# Patient Record
Sex: Male | Born: 1955 | Race: White | Hispanic: No | Marital: Married | Smoking: Never smoker
Health system: Southern US, Community
[De-identification: ages and names within clinical notes are randomized; demographics above are authoritative.]

## PROBLEM LIST (undated history)

## (undated) DIAGNOSIS — I10 Essential (primary) hypertension: Secondary | ICD-10-CM

## (undated) DIAGNOSIS — M1990 Unspecified osteoarthritis, unspecified site: Secondary | ICD-10-CM

## (undated) DIAGNOSIS — D3613 Benign neoplasm of peripheral nerves and autonomic nervous system of lower limb, including hip: Secondary | ICD-10-CM

## (undated) DIAGNOSIS — E7800 Pure hypercholesterolemia, unspecified: Secondary | ICD-10-CM

## (undated) HISTORY — PX: HEMORRHOID SURGERY: SHX153

## (undated) HISTORY — PX: TONSILLECTOMY: SUR1361

## (undated) HISTORY — PX: FRACTURE SURGERY: SHX138

## (undated) HISTORY — PX: VASECTOMY: SHX75

---

## 2003-03-13 ENCOUNTER — Ambulatory Visit (HOSPITAL_COMMUNITY): Admission: RE | Admit: 2003-03-13 | Discharge: 2003-03-13 | Payer: Self-pay | Admitting: Gastroenterology

## 2012-10-18 DIAGNOSIS — D126 Benign neoplasm of colon, unspecified: Secondary | ICD-10-CM | POA: Insufficient documentation

## 2013-08-14 ENCOUNTER — Other Ambulatory Visit: Payer: Self-pay | Admitting: Gastroenterology

## 2013-09-18 ENCOUNTER — Encounter (HOSPITAL_COMMUNITY): Payer: Self-pay

## 2013-09-18 ENCOUNTER — Encounter (HOSPITAL_COMMUNITY)
Admission: RE | Disposition: A | Discharge status: Home / Self Care | Payer: Self-pay | Source: Ambulatory Visit | Attending: Gastroenterology

## 2013-09-18 ENCOUNTER — Ambulatory Visit (HOSPITAL_COMMUNITY)
Admission: RE | Admit: 2013-09-18 | Discharge: 2013-09-18 | Disposition: A | Discharge status: Home / Self Care | Payer: Federal, State, Local not specified - PPO | Source: Ambulatory Visit | Attending: Gastroenterology | Admitting: Gastroenterology

## 2013-09-18 DIAGNOSIS — E7800 Pure hypercholesterolemia, unspecified: Secondary | ICD-10-CM | POA: Insufficient documentation

## 2013-09-18 DIAGNOSIS — D126 Benign neoplasm of colon, unspecified: Secondary | ICD-10-CM | POA: Insufficient documentation

## 2013-09-18 DIAGNOSIS — K921 Melena: Secondary | ICD-10-CM | POA: Insufficient documentation

## 2013-09-18 DIAGNOSIS — K5730 Diverticulosis of large intestine without perforation or abscess without bleeding: Secondary | ICD-10-CM | POA: Insufficient documentation

## 2013-09-18 HISTORY — PX: COLONOSCOPY: SHX5424

## 2013-09-18 HISTORY — DX: Pure hypercholesterolemia, unspecified: E78.00

## 2013-09-18 SURGERY — COLONOSCOPY
Anesthesia: Moderate Sedation

## 2013-09-18 MED ORDER — MIDAZOLAM HCL 10 MG/2ML IJ SOLN
INTRAMUSCULAR | Status: DC | PRN
  Administered 2013-09-18 (×3): 2.5 mg via INTRAVENOUS

## 2013-09-18 MED ORDER — SODIUM CHLORIDE 0.9 % IV SOLN: INTRAVENOUS | Status: DC

## 2013-09-18 MED ORDER — FENTANYL CITRATE 0.05 MG/ML IJ SOLN
INTRAMUSCULAR | Status: DC | PRN
  Administered 2013-09-18: 25 ug via INTRAVENOUS
  Administered 2013-09-18: 50 ug via INTRAVENOUS

## 2013-09-18 MED ORDER — DIPHENHYDRAMINE HCL 50 MG/ML IJ SOLN
INTRAMUSCULAR | Status: AC
  Filled 2013-09-18: qty 1

## 2013-09-18 MED ORDER — FENTANYL CITRATE 0.05 MG/ML IJ SOLN
INTRAMUSCULAR | Status: AC
  Filled 2013-09-18: qty 2

## 2013-09-18 MED ORDER — MIDAZOLAM HCL 10 MG/2ML IJ SOLN
INTRAMUSCULAR | Status: AC
  Filled 2013-09-18: qty 2

## 2013-09-18 NOTE — H&P (Signed)
  Problem: Small-volume hematochezia on aspirin. Normal CBC.  History: The patient is a 57 year old male born Nov 15, 1955. He underwent a normal screening colonoscopy on 03/13/2003.  The patient is scheduled to undergo a diagnostic colonoscopy following an episode of small-volume hematochezia.  Allergies: Lipitor and Crestor causes muscle aches.  Past medical history: Hypercholesterolemia. Nasal surgery. Hemorrhoidectomy. Tonsillectomy. Left ankle fracture surgery.  Exam: The patient is alert and lying comfortably on the endoscopy stretcher. Abdomen is soft and nontender to palpation. Lungs are clear to auscultation. Cardiac exam reveals a regular rhythm  Plan: Proceed with diagnostic colonoscopy to evaluate small-volume hematochezia

## 2013-09-18 NOTE — Op Note (Signed)
Problem: Small-volume hematochezia  Endoscopist: Danise Edge  Premedication: Versed 7.5 mg. Fentanyl 75 mcg  Procedure: Diagnostic colonoscopy The patient was placed in the left lateral decubitus position. Anal inspection and digital rectal exam were normal. The Pentax pediatric colonoscope was introduced into the rectum and easily advanced to the cecum. A normal-appearing ileocecal valve and appendiceal orifice were identified. The ileocecal valve was intubated and the terminal ileum inspected. Colonic preparation for the exam today was good.  Rectum. Normal. Retroflexed view of the distal rectum normal.  Sigmoid colon and descending colon. Left colonic diverticulosis  Splenic flexure. Normal  Transverse colon. Normal  Hepatic flexure. Normal  Ascending colon. Normal  Cecum and ileocecal valve. From the proximal cecum a 3 mm sessile polyp was removed with the cold biopsy forceps.  Assessment:  #1. From the cecum, a 3 mm sessile polyp was removed with the cold biopsy forceps  #2. Left colonic diverticulosis  Recommendations: If colon polyp returns neoplastic pathologically, the patient should undergo a surveillance colonoscopy in 5 years. If the colon polyp returns nonneoplastic pathologically, the patient should undergo a repeat screening colonoscopy in 10 years.

## 2013-09-19 ENCOUNTER — Encounter (HOSPITAL_COMMUNITY): Payer: Self-pay | Admitting: Gastroenterology

## 2013-12-19 ENCOUNTER — Ambulatory Visit (INDEPENDENT_AMBULATORY_CARE_PROVIDER_SITE_OTHER): Payer: Federal, State, Local not specified - PPO | Admitting: Family Medicine

## 2013-12-19 VITALS — BP 120/76 | HR 62 | Temp 98.0°F | Resp 16 | Ht 66.5 in | Wt 203.2 lb

## 2013-12-19 DIAGNOSIS — J329 Chronic sinusitis, unspecified: Secondary | ICD-10-CM

## 2013-12-19 MED ORDER — AMOXICILLIN 875 MG PO TABS: 875.0000 mg | ORAL_TABLET | Freq: Two times a day (BID) | ORAL | Status: DC

## 2013-12-19 NOTE — Progress Notes (Signed)
° ° ° °  This chart was scribed for Robyn Haber, MD by Forrestine Him, [facility] Scribe. This patient was seen in room 5 and the patient's care was started 7:47 PM.    Patient Name: Jonathan Peters Date of Birth: 1956/06/21 Medical Record Number: [id number] Gender: male Date of Encounter: 12/19/2013  Chief Complaint: Headache   History of Present Illness:  Jonathan Peters is a 58 y.o. very pleasant male patient who presents with the following:  Pt presents with gradual onset, intermittent, moderate left sided HAs described as "dull" with associated sinus pressure that initially started 3-4 days ago. Pt admits to a history or a similar episode, but states he typically does not experience HAs regularly. He has not tried anything OTC for his symptoms. Denies any alleviating factors. At this time he denies any visual changes or disturbances. Denies any hearing loss, cough, or abdominal pain. His PMHx includes Hypercholesterolemia. No other complaints or concerns this visit.  Pt currently works in [employer].  There are no active problems to display for this patient.  Past Medical History  Diagnosis Date   Hypercholesterolemia    Past Surgical History  Procedure Laterality Date   Hemorrhoid surgery     Tonsillectomy     Fracture surgery Left     ankle   Colonoscopy N/A 09/18/2013    Procedure: COLONOSCOPY;  Surgeon: Garlan Fair, MD;  Location: [facility];  Service: Endoscopy;  Laterality: N/A;   Vasectomy     History  Substance Use Topics   Smoking status: Never Smoker    Smokeless tobacco: Not on file   Alcohol Use: 8.4 oz/week    14 Cans of beer per week   Family History  Problem Relation Age of Onset   Cancer Mother    Hypertension Father    No Known Allergies  Medication list has been reviewed and updated.  Current Outpatient Prescriptions on File Prior to Visit  Medication Sig Dispense Refill   aspirin 81 MG tablet Take 81  mg by mouth daily.       cholecalciferol (VITAMIN D) 1000 UNITS tablet Take 1,000 Units by mouth daily.       ezetimibe (ZETIA) 10 MG tablet Take 10 mg by mouth daily.       Multiple Vitamin (MULTIVITAMIN) tablet Take 1 tablet by mouth daily.       omega-3 acid ethyl esters (LOVAZA) 1 G capsule Take 1 g by mouth daily.       No current facility-administered medications on file prior to visit.    Review of Systems:    Physical Examination: Filed Vitals:   12/19/13 1923  BP: 120/76  Pulse: 62  Temp: 98 F (36.7 C)  Resp: 16   @vitals2 @ Body mass index is 32.31 kg/(m^2). Ideal Body Weight: @FLOWAMB ([id number])@ No acute distress HEENT: Mild nasal swelling bilaterally, otherwise negative Chest: Clear Heart: Regular no murmur Hearing normal  EKG / Labs / Xrays: None available at time of encounter  Assessment and Plan: Sinusitis - Plan: amoxicillin (AMOXIL) 875 MG tablet    I personally performed the services described in this documentation, which was scribed in my presence. The recorded information has been reviewed and is accurate.    Robyn Haber, MD

## 2013-12-19 NOTE — Patient Instructions (Signed)

## 2013-12-26 ENCOUNTER — Telehealth: Payer: Self-pay

## 2013-12-26 DIAGNOSIS — J329 Chronic sinusitis, unspecified: Secondary | ICD-10-CM

## 2013-12-26 NOTE — Telephone Encounter (Signed)
Patient is requesting stronger antibiotic for sinus treatment.  Somewhat better.  [facility]   [phone]

## 2013-12-26 NOTE — Telephone Encounter (Signed)
Not as bad, still having headaches towards the evening and at night. taking ibuprofen OTC 1 tablet every 4-6 hours as needed, is helping but not completely. Instructed patient to take 2 (200mg ) tablets of ibuprofen every 4-6 hours to se if this aids in relieving his headaches. Instructed to try this for a few days and if the headaches do not subside call back.

## 2013-12-29 NOTE — Telephone Encounter (Signed)
PT STILL WANTS SOMETHING STRONGER. THE DOUBLE DOSE OF IBUPROFEN DID NOT WORK. HE STILL HAS HEADACHES.

## 2014-01-01 MED ORDER — TRAMADOL HCL 50 MG PO TABS: 50.0000 mg | ORAL_TABLET | Freq: Three times a day (TID) | ORAL | Status: DC | PRN

## 2014-01-01 NOTE — Telephone Encounter (Signed)
Pt stated that the ibuprofen helped only a little bit but he would like something else.

## 2014-01-02 ENCOUNTER — Telehealth: Payer: Self-pay

## 2014-01-02 NOTE — Telephone Encounter (Signed)
Faxed

## 2014-01-02 NOTE — Telephone Encounter (Signed)
Patient notified medication is at pharmacy. I confirmed with pharmacy medication is ready for pick-up

## 2014-01-02 NOTE — Telephone Encounter (Signed)
Patient is requesting antibiotic still has sinus infection, has been waiting for a phone call back but no one has called him, he is going out of town tomorrow

## 2014-01-02 NOTE — Telephone Encounter (Signed)
Dr. Carlean Jews prescribed tramadol 3/17. This needs to be called into the pharmacy [facility].

## 2014-01-02 NOTE — Telephone Encounter (Signed)
Notified pt done  

## 2014-01-03 ENCOUNTER — Other Ambulatory Visit: Payer: Self-pay | Admitting: Family Medicine

## 2014-01-03 ENCOUNTER — Telehealth: Payer: Self-pay

## 2014-01-03 DIAGNOSIS — R059 Cough, unspecified: Secondary | ICD-10-CM

## 2014-01-03 DIAGNOSIS — R05 Cough: Secondary | ICD-10-CM

## 2014-01-03 MED ORDER — AZITHROMYCIN 250 MG PO TABS: ORAL_TABLET | ORAL | Status: DC

## 2014-01-03 MED ORDER — HYDROCOD POLST-CHLORPHEN POLST 10-8 MG/5ML PO LQCR: 5.0000 mL | Freq: Two times a day (BID) | ORAL | Status: DC | PRN

## 2014-01-03 NOTE — Telephone Encounter (Signed)
Called and spoke with pt's wife. States that pt took [place] that was prescribed when he saw you at the visit on 12/19/13. Wife states that pateint is still feeling no better and was wanting something other then the tramadol that was sent in/ A stronger antibiotic. States that patient is continuing to have symptoms and feels worse. Wife also stated that they were on there way out of town so if you were able to send in a strong antibiotic for him they would want it sent to the [facility] near their hotel in [place]. [facility] [phone]. [place] . [place]

## 2014-01-03 NOTE — Telephone Encounter (Signed)
We called in tramadol yesterday for this patient, he wanted an antibiotic not pain medication [phone]

## 2015-04-24 ENCOUNTER — Other Ambulatory Visit: Payer: Self-pay | Admitting: Internal Medicine

## 2015-04-24 DIAGNOSIS — R253 Fasciculation: Secondary | ICD-10-CM

## 2015-04-24 DIAGNOSIS — R29898 Other symptoms and signs involving the musculoskeletal system: Secondary | ICD-10-CM

## 2015-04-24 DIAGNOSIS — M542 Cervicalgia: Secondary | ICD-10-CM

## 2015-04-29 ENCOUNTER — Ambulatory Visit
Admission: RE | Admit: 2015-04-29 | Discharge: 2015-04-29 | Disposition: A | Discharge status: Home / Self Care | Payer: 59 | Source: Ambulatory Visit | Attending: Internal Medicine | Admitting: Internal Medicine

## 2015-04-29 DIAGNOSIS — M542 Cervicalgia: Secondary | ICD-10-CM

## 2015-04-29 DIAGNOSIS — R29898 Other symptoms and signs involving the musculoskeletal system: Secondary | ICD-10-CM

## 2015-04-29 DIAGNOSIS — R253 Fasciculation: Secondary | ICD-10-CM

## 2017-03-23 DIAGNOSIS — M170 Bilateral primary osteoarthritis of knee: Secondary | ICD-10-CM | POA: Diagnosis not present

## 2017-03-31 DIAGNOSIS — M170 Bilateral primary osteoarthritis of knee: Secondary | ICD-10-CM | POA: Diagnosis not present

## 2017-04-08 DIAGNOSIS — M170 Bilateral primary osteoarthritis of knee: Secondary | ICD-10-CM | POA: Diagnosis not present

## 2017-04-08 DIAGNOSIS — M545 Low back pain: Secondary | ICD-10-CM | POA: Diagnosis not present

## 2017-04-27 DIAGNOSIS — M5136 Other intervertebral disc degeneration, lumbar region: Secondary | ICD-10-CM | POA: Diagnosis not present

## 2017-04-27 DIAGNOSIS — M545 Low back pain: Secondary | ICD-10-CM | POA: Diagnosis not present

## 2017-06-09 DIAGNOSIS — M5136 Other intervertebral disc degeneration, lumbar region: Secondary | ICD-10-CM | POA: Diagnosis not present

## 2017-12-26 DIAGNOSIS — M1710 Unilateral primary osteoarthritis, unspecified knee: Secondary | ICD-10-CM | POA: Insufficient documentation

## 2017-12-26 DIAGNOSIS — M179 Osteoarthritis of knee, unspecified: Secondary | ICD-10-CM | POA: Insufficient documentation

## 2018-01-09 DIAGNOSIS — L570 Actinic keratosis: Secondary | ICD-10-CM | POA: Diagnosis not present

## 2018-03-09 DIAGNOSIS — M170 Bilateral primary osteoarthritis of knee: Secondary | ICD-10-CM | POA: Diagnosis not present

## 2018-04-14 DIAGNOSIS — M170 Bilateral primary osteoarthritis of knee: Secondary | ICD-10-CM | POA: Diagnosis not present

## 2018-04-19 DIAGNOSIS — M170 Bilateral primary osteoarthritis of knee: Secondary | ICD-10-CM | POA: Diagnosis not present

## 2018-04-28 DIAGNOSIS — M170 Bilateral primary osteoarthritis of knee: Secondary | ICD-10-CM | POA: Diagnosis not present

## 2018-05-09 ENCOUNTER — Other Ambulatory Visit: Payer: Self-pay | Admitting: Internal Medicine

## 2018-05-09 ENCOUNTER — Ambulatory Visit
Admission: RE | Admit: 2018-05-09 | Discharge: 2018-05-09 | Disposition: A | Discharge status: Home / Self Care | Payer: 59 | Source: Ambulatory Visit | Attending: Internal Medicine | Admitting: Internal Medicine

## 2018-05-09 DIAGNOSIS — R1084 Generalized abdominal pain: Secondary | ICD-10-CM

## 2018-05-09 DIAGNOSIS — K5900 Constipation, unspecified: Secondary | ICD-10-CM | POA: Diagnosis not present

## 2018-10-24 DIAGNOSIS — M170 Bilateral primary osteoarthritis of knee: Secondary | ICD-10-CM | POA: Diagnosis not present

## 2018-12-01 DIAGNOSIS — I10 Essential (primary) hypertension: Secondary | ICD-10-CM | POA: Diagnosis not present

## 2018-12-01 DIAGNOSIS — Z125 Encounter for screening for malignant neoplasm of prostate: Secondary | ICD-10-CM | POA: Diagnosis not present

## 2018-12-01 DIAGNOSIS — E7800 Pure hypercholesterolemia, unspecified: Secondary | ICD-10-CM | POA: Diagnosis not present

## 2018-12-01 DIAGNOSIS — Z0000 Encounter for general adult medical examination without abnormal findings: Secondary | ICD-10-CM | POA: Diagnosis not present

## 2019-02-22 DIAGNOSIS — M170 Bilateral primary osteoarthritis of knee: Secondary | ICD-10-CM | POA: Diagnosis not present

## 2019-03-01 DIAGNOSIS — M170 Bilateral primary osteoarthritis of knee: Secondary | ICD-10-CM | POA: Diagnosis not present

## 2019-03-08 DIAGNOSIS — M170 Bilateral primary osteoarthritis of knee: Secondary | ICD-10-CM | POA: Diagnosis not present

## 2019-03-19 DIAGNOSIS — K5900 Constipation, unspecified: Secondary | ICD-10-CM | POA: Diagnosis not present

## 2019-03-19 DIAGNOSIS — K635 Polyp of colon: Secondary | ICD-10-CM | POA: Diagnosis not present

## 2019-03-19 DIAGNOSIS — K649 Unspecified hemorrhoids: Secondary | ICD-10-CM | POA: Diagnosis not present

## 2019-03-22 DIAGNOSIS — K6289 Other specified diseases of anus and rectum: Secondary | ICD-10-CM | POA: Diagnosis not present

## 2019-03-22 DIAGNOSIS — K625 Hemorrhage of anus and rectum: Secondary | ICD-10-CM | POA: Diagnosis not present

## 2019-03-22 DIAGNOSIS — K5900 Constipation, unspecified: Secondary | ICD-10-CM | POA: Diagnosis not present

## 2019-04-06 DIAGNOSIS — Z86010 Personal history of colonic polyps: Secondary | ICD-10-CM | POA: Diagnosis not present

## 2019-04-06 DIAGNOSIS — K5730 Diverticulosis of large intestine without perforation or abscess without bleeding: Secondary | ICD-10-CM | POA: Diagnosis not present

## 2019-04-06 DIAGNOSIS — K621 Rectal polyp: Secondary | ICD-10-CM | POA: Diagnosis not present

## 2019-04-06 DIAGNOSIS — K635 Polyp of colon: Secondary | ICD-10-CM | POA: Diagnosis not present

## 2019-04-06 DIAGNOSIS — D120 Benign neoplasm of cecum: Secondary | ICD-10-CM | POA: Diagnosis not present

## 2019-04-06 DIAGNOSIS — K640 First degree hemorrhoids: Secondary | ICD-10-CM | POA: Diagnosis not present

## 2019-05-18 DIAGNOSIS — M170 Bilateral primary osteoarthritis of knee: Secondary | ICD-10-CM | POA: Diagnosis not present

## 2019-06-13 DIAGNOSIS — J342 Deviated nasal septum: Secondary | ICD-10-CM | POA: Diagnosis not present

## 2019-06-13 DIAGNOSIS — M278 Other specified diseases of jaws: Secondary | ICD-10-CM | POA: Diagnosis not present

## 2019-06-13 DIAGNOSIS — K080 Exfoliation of teeth due to systemic causes: Secondary | ICD-10-CM | POA: Diagnosis not present

## 2019-06-13 DIAGNOSIS — K0500 Acute gingivitis, plaque induced: Secondary | ICD-10-CM | POA: Diagnosis not present

## 2019-06-13 DIAGNOSIS — K011 Impacted teeth: Secondary | ICD-10-CM | POA: Diagnosis not present

## 2019-06-13 DIAGNOSIS — K148 Other diseases of tongue: Secondary | ICD-10-CM | POA: Diagnosis not present

## 2019-06-13 DIAGNOSIS — K143 Hypertrophy of tongue papillae: Secondary | ICD-10-CM | POA: Diagnosis not present

## 2019-06-13 DIAGNOSIS — J3489 Other specified disorders of nose and nasal sinuses: Secondary | ICD-10-CM | POA: Diagnosis not present

## 2019-06-15 DIAGNOSIS — M170 Bilateral primary osteoarthritis of knee: Secondary | ICD-10-CM | POA: Diagnosis not present

## 2019-06-29 ENCOUNTER — Other Ambulatory Visit: Payer: Self-pay

## 2019-06-29 DIAGNOSIS — K3533 Acute appendicitis with perforation and localized peritonitis, with abscess: Principal | ICD-10-CM | POA: Insufficient documentation

## 2019-06-29 DIAGNOSIS — Z79899 Other long term (current) drug therapy: Secondary | ICD-10-CM | POA: Diagnosis not present

## 2019-06-29 DIAGNOSIS — E7800 Pure hypercholesterolemia, unspecified: Secondary | ICD-10-CM | POA: Diagnosis not present

## 2019-06-29 DIAGNOSIS — R109 Unspecified abdominal pain: Secondary | ICD-10-CM | POA: Diagnosis not present

## 2019-06-29 DIAGNOSIS — K5730 Diverticulosis of large intestine without perforation or abscess without bleeding: Secondary | ICD-10-CM | POA: Diagnosis not present

## 2019-06-29 DIAGNOSIS — Z03818 Encounter for observation for suspected exposure to other biological agents ruled out: Secondary | ICD-10-CM | POA: Diagnosis not present

## 2019-06-29 DIAGNOSIS — K37 Unspecified appendicitis: Secondary | ICD-10-CM | POA: Diagnosis not present

## 2019-06-29 DIAGNOSIS — E785 Hyperlipidemia, unspecified: Secondary | ICD-10-CM | POA: Insufficient documentation

## 2019-06-29 DIAGNOSIS — Z7982 Long term (current) use of aspirin: Secondary | ICD-10-CM | POA: Diagnosis not present

## 2019-06-29 DIAGNOSIS — Z20828 Contact with and (suspected) exposure to other viral communicable diseases: Secondary | ICD-10-CM | POA: Insufficient documentation

## 2019-06-29 DIAGNOSIS — K3580 Unspecified acute appendicitis: Secondary | ICD-10-CM | POA: Diagnosis not present

## 2019-06-29 LAB — CBC
HCT: 43.7 % (ref 39.0–52.0)
Hemoglobin: 15 g/dL (ref 13.0–17.0)
MCH: 31 pg (ref 26.0–34.0)
MCHC: 34.3 g/dL (ref 30.0–36.0)
MCV: 90.3 fL (ref 80.0–100.0)
Platelets: 225 10*3/uL (ref 150–400)
RBC: 4.84 MIL/uL (ref 4.22–5.81)
RDW: 12.9 % (ref 11.5–15.5)
WBC: 12.6 10*3/uL — ABNORMAL HIGH (ref 4.0–10.5)
nRBC: 0 % (ref 0.0–0.2)

## 2019-06-29 LAB — LIPASE, BLOOD: Lipase: 16 U/L (ref 11–51)

## 2019-06-29 LAB — COMPREHENSIVE METABOLIC PANEL
ALT: 24 U/L (ref 0–44)
AST: 24 U/L (ref 15–41)
Albumin: 4.4 g/dL (ref 3.5–5.0)
Alkaline Phosphatase: 48 U/L (ref 38–126)
Anion gap: 12 (ref 5–15)
BUN: 17 mg/dL (ref 8–23)
CO2: 23 mmol/L (ref 22–32)
Calcium: 9 mg/dL (ref 8.9–10.3)
Chloride: 103 mmol/L (ref 98–111)
Creatinine, Ser: 1.06 mg/dL (ref 0.61–1.24)
GFR calc Af Amer: >60 mL/min (ref >60)
GFR calc non Af Amer: >60 mL/min (ref >60)
Glucose, Bld: 127 mg/dL — ABNORMAL HIGH (ref 70–99)
Potassium: 3.5 mmol/L (ref 3.5–5.1)
Sodium: 138 mmol/L (ref 135–145)
Total Bilirubin: 1.1 mg/dL (ref 0.3–1.2)
Total Protein: 7.2 g/dL (ref 6.5–8.1)

## 2019-06-29 MED ORDER — SODIUM CHLORIDE 0.9% FLUSH
3.0000 mL | Freq: Once | INTRAVENOUS | Status: AC
  Administered 2019-06-30: 3 mL via INTRAVENOUS

## 2019-06-29 NOTE — ED Triage Notes (Signed)
Patient reports abdominal pain since 4:30 pm that has gotten progressively worse.

## 2019-06-29 NOTE — ED Notes (Signed)
Patient to stat desk asking about wait time. Patient given update on wait time.  

## 2019-06-30 ENCOUNTER — Encounter
Admission: EM | Disposition: A | Discharge status: Home / Self Care | Payer: Self-pay | Source: Home / Self Care | Attending: Emergency Medicine

## 2019-06-30 ENCOUNTER — Observation Stay: Payer: Federal, State, Local not specified - PPO | Admitting: Anesthesiology

## 2019-06-30 ENCOUNTER — Observation Stay
Admission: EM | Admit: 2019-06-30 | Discharge: 2019-07-01 | Disposition: A | Discharge status: Home / Self Care | Payer: Federal, State, Local not specified - PPO | Attending: Surgery | Admitting: Surgery

## 2019-06-30 ENCOUNTER — Emergency Department: Payer: Federal, State, Local not specified - PPO

## 2019-06-30 DIAGNOSIS — K3580 Unspecified acute appendicitis: Secondary | ICD-10-CM | POA: Diagnosis not present

## 2019-06-30 DIAGNOSIS — K37 Unspecified appendicitis: Secondary | ICD-10-CM | POA: Diagnosis not present

## 2019-06-30 DIAGNOSIS — K5730 Diverticulosis of large intestine without perforation or abscess without bleeding: Secondary | ICD-10-CM | POA: Diagnosis not present

## 2019-06-30 HISTORY — PX: LAPAROSCOPIC APPENDECTOMY: SHX408

## 2019-06-30 LAB — CREATININE, SERUM
Creatinine, Ser: 0.99 mg/dL (ref 0.61–1.24)
GFR calc Af Amer: >60 mL/min (ref >60)
GFR calc non Af Amer: >60 mL/min (ref >60)

## 2019-06-30 LAB — URINALYSIS, COMPLETE (UACMP) WITH MICROSCOPIC
Bacteria, UA: NONE SEEN
Bilirubin Urine: NEGATIVE
Glucose, UA: NEGATIVE mg/dL
Ketones, ur: 20 mg/dL — AB
Leukocytes,Ua: NEGATIVE
Nitrite: NEGATIVE
Protein, ur: NEGATIVE mg/dL
Specific Gravity, Urine: 1.024 (ref 1.005–1.030)
Squamous Epithelial / LPF: NONE SEEN (ref 0–5)
pH: 5 (ref 5.0–8.0)

## 2019-06-30 LAB — SARS CORONAVIRUS 2 BY RT PCR (HOSPITAL ORDER, PERFORMED IN ~~LOC~~ HOSPITAL LAB): SARS Coronavirus 2: NEGATIVE

## 2019-06-30 LAB — SURGICAL PCR SCREEN
MRSA, PCR: NEGATIVE
Staphylococcus aureus: NEGATIVE

## 2019-06-30 SURGERY — APPENDECTOMY, LAPAROSCOPIC
Anesthesia: General

## 2019-06-30 MED ORDER — ONDANSETRON HCL 4 MG/2ML IJ SOLN
INTRAMUSCULAR | Status: AC
  Filled 2019-06-30: qty 2

## 2019-06-30 MED ORDER — LACTATED RINGERS IV SOLN
INTRAVENOUS | Status: DC | PRN
  Administered 2019-06-30: 14:00:00 via INTRAVENOUS

## 2019-06-30 MED ORDER — PROMETHAZINE HCL 25 MG/ML IJ SOLN: 6.2500 mg | INTRAMUSCULAR | Status: DC | PRN

## 2019-06-30 MED ORDER — LIDOCAINE HCL (PF) 2 % IJ SOLN
INTRAMUSCULAR | Status: AC
  Filled 2019-06-30: qty 10

## 2019-06-30 MED ORDER — PROCHLORPERAZINE EDISYLATE 10 MG/2ML IJ SOLN
5.0000 mg | Freq: Four times a day (QID) | INTRAMUSCULAR | Status: DC | PRN
  Filled 2019-06-30: qty 2

## 2019-06-30 MED ORDER — DEXMEDETOMIDINE HCL IN NACL 200 MCG/50ML IV SOLN
INTRAVENOUS | Status: DC | PRN
  Administered 2019-06-30: 16 ug via INTRAVENOUS
  Administered 2019-06-30: 4 ug via INTRAVENOUS

## 2019-06-30 MED ORDER — ACETAMINOPHEN 325 MG PO TABS: 325.0000 mg | ORAL_TABLET | ORAL | Status: DC | PRN

## 2019-06-30 MED ORDER — MIDAZOLAM HCL 2 MG/2ML IJ SOLN
INTRAMUSCULAR | Status: AC
  Filled 2019-06-30: qty 2

## 2019-06-30 MED ORDER — DEXAMETHASONE SODIUM PHOSPHATE 10 MG/ML IJ SOLN
INTRAMUSCULAR | Status: DC | PRN
  Administered 2019-06-30: 10 mg via INTRAVENOUS

## 2019-06-30 MED ORDER — IOHEXOL 300 MG/ML  SOLN
100.0000 mL | Freq: Once | INTRAMUSCULAR | Status: AC | PRN
  Administered 2019-06-30: 100 mL via INTRAVENOUS

## 2019-06-30 MED ORDER — ROCURONIUM BROMIDE 50 MG/5ML IV SOLN
INTRAVENOUS | Status: AC
  Filled 2019-06-30: qty 1

## 2019-06-30 MED ORDER — PIPERACILLIN-TAZOBACTAM 3.375 G IVPB
3.3750 g | Freq: Three times a day (TID) | INTRAVENOUS | Status: DC
  Administered 2019-06-30 – 2019-07-01 (×4): 3.375 g via INTRAVENOUS
  Filled 2019-06-30 (×4): qty 50

## 2019-06-30 MED ORDER — BUPIVACAINE-EPINEPHRINE (PF) 0.25% -1:200000 IJ SOLN
INTRAMUSCULAR | Status: AC
  Filled 2019-06-30: qty 30

## 2019-06-30 MED ORDER — KETOROLAC TROMETHAMINE 30 MG/ML IJ SOLN
30.0000 mg | Freq: Four times a day (QID) | INTRAMUSCULAR | Status: DC | PRN
  Filled 2019-06-30: qty 1

## 2019-06-30 MED ORDER — HYDRALAZINE HCL 20 MG/ML IJ SOLN: 10.0000 mg | INTRAMUSCULAR | Status: DC | PRN

## 2019-06-30 MED ORDER — PROCHLORPERAZINE MALEATE 10 MG PO TABS
10.0000 mg | ORAL_TABLET | Freq: Four times a day (QID) | ORAL | Status: DC | PRN
  Filled 2019-06-30: qty 1

## 2019-06-30 MED ORDER — ACETAMINOPHEN 160 MG/5ML PO SOLN
325.0000 mg | ORAL | Status: DC | PRN
  Filled 2019-06-30: qty 20.3

## 2019-06-30 MED ORDER — SUGAMMADEX SODIUM 200 MG/2ML IV SOLN
INTRAVENOUS | Status: DC | PRN
  Administered 2019-06-30: 200 mg via INTRAVENOUS

## 2019-06-30 MED ORDER — ROCURONIUM BROMIDE 100 MG/10ML IV SOLN
INTRAVENOUS | Status: DC | PRN
  Administered 2019-06-30: 40 mg via INTRAVENOUS

## 2019-06-30 MED ORDER — KETOROLAC TROMETHAMINE 30 MG/ML IJ SOLN: 30.0000 mg | Freq: Once | INTRAMUSCULAR | Status: DC | PRN

## 2019-06-30 MED ORDER — SODIUM CHLORIDE 0.9 % IV SOLN
INTRAVENOUS | Status: DC
  Administered 2019-06-30: 07:00:00 via INTRAVENOUS

## 2019-06-30 MED ORDER — PROPOFOL 10 MG/ML IV BOLUS
INTRAVENOUS | Status: DC | PRN
  Administered 2019-06-30: 200 mg via INTRAVENOUS

## 2019-06-30 MED ORDER — ONDANSETRON HCL 4 MG/2ML IJ SOLN: 4.0000 mg | Freq: Four times a day (QID) | INTRAMUSCULAR | Status: DC | PRN

## 2019-06-30 MED ORDER — ONDANSETRON 4 MG PO TBDP: 4.0000 mg | ORAL_TABLET | Freq: Four times a day (QID) | ORAL | Status: DC | PRN

## 2019-06-30 MED ORDER — PROPOFOL 10 MG/ML IV BOLUS
INTRAVENOUS | Status: AC
  Filled 2019-06-30: qty 20

## 2019-06-30 MED ORDER — PIPERACILLIN-TAZOBACTAM 3.375 G IVPB: 3.3750 g | Freq: Three times a day (TID) | INTRAVENOUS | Status: DC

## 2019-06-30 MED ORDER — HEPARIN SODIUM (PORCINE) 5000 UNIT/ML IJ SOLN
5000.0000 [IU] | Freq: Three times a day (TID) | INTRAMUSCULAR | Status: DC
  Administered 2019-06-30 – 2019-07-01 (×3): 5000 [IU] via SUBCUTANEOUS
  Filled 2019-06-30 (×3): qty 1

## 2019-06-30 MED ORDER — PANTOPRAZOLE SODIUM 40 MG IV SOLR
40.0000 mg | Freq: Every day | INTRAVENOUS | Status: DC
  Administered 2019-06-30: 40 mg via INTRAVENOUS
  Filled 2019-06-30: qty 40

## 2019-06-30 MED ORDER — HYDROCODONE-ACETAMINOPHEN 7.5-325 MG PO TABS
1.0000 | ORAL_TABLET | Freq: Once | ORAL | Status: DC | PRN
  Filled 2019-06-30: qty 1

## 2019-06-30 MED ORDER — ACETAMINOPHEN 10 MG/ML IV SOLN
INTRAVENOUS | Status: DC | PRN
  Administered 2019-06-30: 1000 mg via INTRAVENOUS

## 2019-06-30 MED ORDER — PHENYLEPHRINE HCL (PRESSORS) 10 MG/ML IV SOLN
INTRAVENOUS | Status: DC | PRN
  Administered 2019-06-30 (×2): 200 ug via INTRAVENOUS

## 2019-06-30 MED ORDER — FENTANYL CITRATE (PF) 100 MCG/2ML IJ SOLN: 25.0000 ug | INTRAMUSCULAR | Status: DC | PRN

## 2019-06-30 MED ORDER — SODIUM CHLORIDE 0.9 % IV SOLN
INTRAVENOUS | Status: DC
  Administered 2019-06-30: 16:00:00 via INTRAVENOUS

## 2019-06-30 MED ORDER — SODIUM CHLORIDE 0.9 % IV BOLUS
1000.0000 mL | Freq: Once | INTRAVENOUS | Status: AC
  Administered 2019-06-30: 1000 mL via INTRAVENOUS

## 2019-06-30 MED ORDER — MORPHINE SULFATE (PF) 2 MG/ML IV SOLN
2.0000 mg | INTRAVENOUS | Status: DC | PRN
  Administered 2019-06-30 (×2): 2 mg via INTRAVENOUS
  Filled 2019-06-30 (×3): qty 1

## 2019-06-30 MED ORDER — EPHEDRINE SULFATE 50 MG/ML IJ SOLN
INTRAMUSCULAR | Status: DC | PRN
  Administered 2019-06-30: 10 mg via INTRAVENOUS

## 2019-06-30 MED ORDER — MIDAZOLAM HCL 2 MG/2ML IJ SOLN
INTRAMUSCULAR | Status: DC | PRN
  Administered 2019-06-30: 2 mg via INTRAVENOUS

## 2019-06-30 MED ORDER — FENTANYL CITRATE (PF) 100 MCG/2ML IJ SOLN
INTRAMUSCULAR | Status: DC | PRN
  Administered 2019-06-30: 50 ug via INTRAVENOUS

## 2019-06-30 MED ORDER — FENTANYL CITRATE (PF) 100 MCG/2ML IJ SOLN
INTRAMUSCULAR | Status: AC
  Filled 2019-06-30: qty 2

## 2019-06-30 MED ORDER — BUPIVACAINE-EPINEPHRINE 0.25% -1:200000 IJ SOLN
INTRAMUSCULAR | Status: DC | PRN
  Administered 2019-06-30: 30 mL

## 2019-06-30 MED ORDER — LIDOCAINE HCL (CARDIAC) PF 100 MG/5ML IV SOSY
PREFILLED_SYRINGE | INTRAVENOUS | Status: DC | PRN
  Administered 2019-06-30: 100 mg via INTRAVENOUS

## 2019-06-30 MED ORDER — FENTANYL CITRATE (PF) 100 MCG/2ML IJ SOLN
50.0000 ug | Freq: Once | INTRAMUSCULAR | Status: AC
  Administered 2019-06-30: 50 ug via INTRAVENOUS
  Filled 2019-06-30: qty 2

## 2019-06-30 MED ORDER — MEPERIDINE HCL 50 MG/ML IJ SOLN: 6.2500 mg | INTRAMUSCULAR | Status: DC | PRN

## 2019-06-30 MED ORDER — OXYCODONE HCL 5 MG PO TABS: 5.0000 mg | ORAL_TABLET | ORAL | Status: DC | PRN

## 2019-06-30 MED ORDER — ONDANSETRON HCL 4 MG/2ML IJ SOLN
INTRAMUSCULAR | Status: DC | PRN
  Administered 2019-06-30: 4 mg via INTRAVENOUS

## 2019-06-30 SURGICAL SUPPLY — 37 items
APPLIER CLIP 5 13 M/L LIGAMAX5 (MISCELLANEOUS)
BLADE CLIPPER SURG (BLADE) ×2 IMPLANT
CANISTER SUCT 1200ML W/VALVE (MISCELLANEOUS) ×2 IMPLANT
CHLORAPREP W/TINT 26 (MISCELLANEOUS) ×2 IMPLANT
CLIP APPLIE 5 13 M/L LIGAMAX5 (MISCELLANEOUS) IMPLANT
COVER WAND RF STERILE (DRAPES) ×2 IMPLANT
CUTTER FLEX LINEAR 45M (STAPLE) ×2 IMPLANT
DERMABOND ADVANCED (GAUZE/BANDAGES/DRESSINGS) ×1
DERMABOND ADVANCED .7 DNX12 (GAUZE/BANDAGES/DRESSINGS) ×1 IMPLANT
ELECT CAUTERY BLADE 6.4 (BLADE) ×2 IMPLANT
ELECT REM PT RETURN 9FT ADLT (ELECTROSURGICAL) ×2
ELECTRODE REM PT RTRN 9FT ADLT (ELECTROSURGICAL) ×1 IMPLANT
GLOVE BIO SURGEON STRL SZ7 (GLOVE) ×2 IMPLANT
GOWN STRL REUS W/ TWL LRG LVL3 (GOWN DISPOSABLE) ×2 IMPLANT
GOWN STRL REUS W/TWL LRG LVL3 (GOWN DISPOSABLE) ×2
IRRIGATION STRYKERFLOW (MISCELLANEOUS) ×1 IMPLANT
IRRIGATOR STRYKERFLOW (MISCELLANEOUS) ×2
IV NS 1000ML (IV SOLUTION) ×1
IV NS 1000ML BAXH (IV SOLUTION) ×1 IMPLANT
NEEDLE HYPO 22GX1.5 SAFETY (NEEDLE) ×2 IMPLANT
NS IRRIG 500ML POUR BTL (IV SOLUTION) ×2 IMPLANT
PACK LAP CHOLECYSTECTOMY (MISCELLANEOUS) ×2 IMPLANT
PENCIL ELECTRO HAND CTR (MISCELLANEOUS) ×2 IMPLANT
POUCH SPECIMEN RETRIEVAL 10MM (ENDOMECHANICALS) ×2 IMPLANT
RELOAD 45 VASCULAR/THIN (ENDOMECHANICALS) IMPLANT
RELOAD STAPLE TA45 3.5 REG BLU (ENDOMECHANICALS) ×2 IMPLANT
SCISSORS METZENBAUM CVD 33 (INSTRUMENTS) IMPLANT
SHEARS HARMONIC ACE PLUS 36CM (ENDOMECHANICALS) ×2 IMPLANT
SLEEVE ENDOPATH XCEL 5M (ENDOMECHANICALS) ×2 IMPLANT
SPONGE LAP 18X18 RF (DISPOSABLE) ×2 IMPLANT
SUT MNCRL AB 4-0 PS2 18 (SUTURE) ×2 IMPLANT
SUT VICRYL 0 AB UR-6 (SUTURE) ×4 IMPLANT
SYR 20ML LL LF (SYRINGE) ×2 IMPLANT
TRAY FOLEY MTR SLVR 16FR STAT (SET/KITS/TRAYS/PACK) IMPLANT
TROCAR XCEL BLUNT TIP 100MML (ENDOMECHANICALS) ×2 IMPLANT
TROCAR XCEL NON-BLD 5MMX100MML (ENDOMECHANICALS) ×4 IMPLANT
TUBING EVAC SMOKE HEATED PNEUM (TUBING) ×2 IMPLANT

## 2019-06-30 NOTE — ED Provider Notes (Signed)
[facility] Emergency Department Provider Note  ____________________________________________   I have reviewed the triage vital signs and the nursing notes.   HISTORY  Chief Complaint Abdominal Pain   History limited by: Not Limited   HPI Jonathan Peters is a 63 y.o. male who presents to the emergency department today because of concerns for abdominal pain.  Patient's abdominal pain started yesterday afternoon.  States located primarily in the lower abdomen.  This incident has been progressive and gradually getting worse.  The patient states that he felt like he needed to have a bowel movement.  He did have normal bowel movements prior to the pain starting today.  The patient has not noticed any change in urination.  He states he has had similar pain twice in the past.  Denies any recent fevers.  Denies any unusual ingestions.  Records reviewed. Per medical record review patient has a history of HLD  Past Medical History:  Diagnosis Date  . Hypercholesterolemia     There are no active problems to display for this patient.   Past Surgical History:  Procedure Laterality Date  . COLONOSCOPY N/A 09/18/2013   Procedure: COLONOSCOPY;  Surgeon: Garlan Fair, MD;  Location: [facility];  Service: Endoscopy;  Laterality: N/A;  . FRACTURE SURGERY Left    ankle  . HEMORRHOID SURGERY    . TONSILLECTOMY    . VASECTOMY      Prior to Admission medications   Medication Sig Start Date End Date Taking? Authorizing Provider  aspirin 81 MG tablet Take 81 mg by mouth daily.    [provider]  azithromycin (ZITHROMAX Z-PAK) 250 MG tablet Take as directed on pack 01/03/14   Robyn Haber, MD  chlorpheniramine-HYDROcodone [facility] PENNKINETIC ER) 10-8 MG/5ML LQCR Take 5 mLs by mouth every 12 (twelve) hours as needed for cough. 01/03/14   Robyn Haber, MD  cholecalciferol (VITAMIN D) 1000 UNITS tablet Take 1,000 Units by mouth daily.    [provider]  ezetimibe (ZETIA) 10 MG tablet Take 10 mg by mouth daily.    [provider]  Multiple Vitamin (MULTIVITAMIN) tablet Take 1 tablet by mouth daily.    [provider]  omega-3 acid ethyl esters (LOVAZA) 1 G capsule Take 1 g by mouth daily.    [provider]    Allergies Azithromycin  Family History  Problem Relation Age of Onset  . Cancer Mother   . Hypertension Father     Social History Social History   Tobacco Use  . Smoking status: Never Smoker  Substance Use Topics  . Alcohol use: Yes    Alcohol/week: 14.0 standard drinks    Types: 14 Cans of beer per week  . Drug use: No    Review of Systems Constitutional: No fever/chills Eyes: No visual changes. ENT: No sore throat. Cardiovascular: Denies chest pain. Respiratory: Denies shortness of breath. Gastrointestinal: Positive for lower abdominal pain. Genitourinary: Negative for dysuria. Musculoskeletal: Negative for back pain. Skin: Negative for rash. Neurological: Negative for headaches, focal weakness or numbness.  ____________________________________________   PHYSICAL EXAM:  VITAL SIGNS: ED Triage Vitals  Enc Vitals Group     BP 06/29/19 2104 136/81     Pulse Rate 06/29/19 2104 60     Resp 06/29/19 2104 18     Temp 06/29/19 2104 (!) 97.5 F (36.4 C)     Temp Source 06/29/19 2104 Oral     SpO2 06/29/19 2104 100 %     Weight  06/29/19 2103 180 lb (81.6 kg)     Height 06/29/19 2103 5\' 7"  (1.702 m)     Head Circumference --      Peak Flow --      Pain Score 06/29/19 2104 7   Constitutional: Alert and oriented.  Eyes: Conjunctivae are normal.  ENT      Head: Normocephalic and atraumatic.      Nose: No congestion/rhinnorhea.      Mouth/Throat: Mucous membranes are moist.      Neck: No stridor. Hematological/Lymphatic/Immunilogical: No cervical lymphadenopathy. Cardiovascular: Normal rate, regular rhythm.  No murmurs, rubs, or gallops.  Respiratory: Normal  respiratory effort without tachypnea nor retractions. Breath sounds are clear and equal bilaterally. No wheezes/rales/rhonchi. Gastrointestinal: Soft and tender in the suprapubic and right lower quadrant.  Genitourinary: Deferred Musculoskeletal: Normal range of motion in all extremities. No lower extremity edema. Neurologic:  Normal speech and language. No gross focal neurologic deficits are appreciated.  Skin:  Skin is warm, dry and intact. No rash noted. Psychiatric: Mood and affect are normal. Speech and behavior are normal. Patient exhibits appropriate insight and judgment.  ____________________________________________    LABS (pertinent positives/negatives)  Lipase 16 CBC wbc 12.6, hgb 15.0, plt 225 CMP wnl except glu 127  ____________________________________________   EKG  None  ____________________________________________    RADIOLOGY  CT abd/pel Acute appendicitis  ____________________________________________   PROCEDURES  Procedures  ____________________________________________   INITIAL IMPRESSION / ASSESSMENT AND PLAN / ED COURSE  Pertinent labs & imaging results that were available during my care of the patient were reviewed by me and considered in my medical decision making (see chart for details).   Patient presented to the emergency department today because of concerns for abdominal pain.  On exam patient is tender in the suprapubic and right lower quadrant.  Patient with mild leukocytosis on blood work.  Given concern for appendicitis CT abdomen pelvis was performed which should confirm appendicitis.  Discussed with Dr. Dahlia Byes with surgery.  Discussed findings with patient.  ___________________________________________   FINAL CLINICAL IMPRESSION(S) / ED DIAGNOSES  Final diagnoses:  Acute appendicitis, unspecified acute appendicitis type     Note: This dictation was prepared with Dragon dictation. Any transcriptional errors that result from  this process are unintentional     Nance Pear, MD 06/30/19 [phone]

## 2019-06-30 NOTE — Anesthesia Procedure Notes (Signed)
Procedure Name: Intubation Date/Time: 06/30/2019 1:49 PM Performed by: Doreen Salvage, CRNA Pre-anesthesia Checklist: Patient identified, Patient being monitored, Timeout performed, Emergency Drugs available and Suction available Patient Re-evaluated:Patient Re-evaluated prior to induction Oxygen Delivery Method: Circle system utilized Preoxygenation: Pre-oxygenation with 100% oxygen Induction Type: IV induction Ventilation: Mask ventilation without difficulty Laryngoscope Size: Mac, McGraph and 4 Grade View: Grade I Tube type: Oral Tube size: 7.5 mm Number of attempts: 1 Airway Equipment and Method: Stylet Placement Confirmation: ETT inserted through vocal cords under direct vision,  positive ETCO2 and breath sounds checked- equal and bilateral Secured at: 21 cm Tube secured with: Tape Dental Injury: Teeth and Oropharynx as per pre-operative assessment

## 2019-06-30 NOTE — ED Notes (Signed)
Urine and urine culture obtained and sent  

## 2019-06-30 NOTE — Progress Notes (Signed)
Pharmacy Antibiotic Note  Jonathan Peters is a 63 y.o. male admitted on 06/30/2019 with acute appendicitis.  Pharmacy has been consulted for zosyn dosing.  Plan: Zosyn 3.375g IV q8h (4 hour infusion).  Height: 5\' 7"  (170.2 cm) Weight: 180 lb (81.6 kg) IBW/kg (Calculated) : 66.1  Temp (24hrs), Avg:97.5 F (36.4 C), Min:97.5 F (36.4 C), Max:97.5 F (36.4 C)  Recent Labs  Lab 06/29/19 2112  WBC 12.6*  CREATININE 1.06    Estimated Creatinine Clearance: 72.9 mL/min (by C-G formula based on SCr of 1.06 mg/dL).    Allergies  Allergen Reactions  . Azithromycin Rash   Thank you for allowing pharmacy to be a part of this patient's care.  Tobie Lords, PharmD, BCPS Clinical Pharmacist 06/30/2019 2:48 AM

## 2019-06-30 NOTE — Progress Notes (Signed)
15 minute call to floor. 

## 2019-06-30 NOTE — Op Note (Signed)
laparascopic appendectomy   Annamary Rummage Date of operation:  06/30/2019  Indications: The patient presented with a history of  abdominal pain. Workup has revealed findings consistent with acute appendicitis.  Pre-operative Diagnosis: Acute appendicitis without mention of peritonitis  Post-operative Diagnosis: Same  Surgeon: Caroleen Hamman, MD, FACS  Anesthesia: General with endotracheal tube  Findings: acute non  Perforated appendicitis  Estimated Blood Loss: 5cc         Specimens: appendix         Complications:  none  Procedure Details  The patient was seen again in the preop area. The options of surgery versus observation were reviewed with the patient and/or family. The risks of bleeding, infection, recurrence of symptoms, negative laparoscopy, potential for an open procedure, bowel injury, abscess or infection, were all reviewed as well. The patient was taken to Operating Room, identified as MUMIN SAPPEN[place] and the procedure verified as laparoscopic appendectomy. A Time Out was held and the above information confirmed.  The patient was placed in the supine position and general anesthesia was induced.  Antibiotic prophylaxis was administered and VT E prophylaxis was in place. A Foley catheter was placed by the nursing staff.   The abdomen was prepped and draped in a sterile fashion. An infraumbilical incision was made. A cutdown technique was used to enter the abdominal cavity. Two vicryl stitches were placed on the fascia and a Hasson trocar inserted. Pneumoperitoneum obtained. Two 5 mm ports were placed under direct visualization.   The appendix was identified and found to be acutely inflamed The appendix was carefully dissected. The mesoappendix was divided withHarmonic scalpel. The base of the appendix was dissected out and divided with a standard load Endo GIA.The appendix was placed in a Endo Catch bag and removed via the Hasson port. The right lower quadrant and pelvis was  then irrigated with  normal saline which was aspirated. Inspection  failed to identify any additional bleeding and there were no signs of bowel injury. Again the right lower quadrant was inspected there was no sign of bleeding or bowel injury therefore pneumoperitoneum was released, all ports were removed.  The umbilical fascia was closed with 0 Vicryl interrupted sutures and the skin incisions were approximated with subcuticular 4-0 Monocryl. Dermabond was placed The patient tolerated the procedure well, there were no complications. The sponge lap and needle count were correct at the end of the procedure.  The patient was taken to the recovery room in stable condition to be admitted for continued care.    Caroleen Hamman, MD FACS

## 2019-06-30 NOTE — ED Notes (Signed)
ED TO INPATIENT HANDOFF REPORT  ED Nurse Name and Phone #:    S Name/Age/Gender Jonathan Peters 63 y.o. male Room/Bed: ED15A/ED15A  Code Status   Code Status: Not on file  Home/SNF/Other Home Patient oriented to: self, place, time and situation Is this baseline? Yes   Triage Complete: Triage complete  Chief Complaint Abd Pain  Triage Note Patient reports abdominal pain since 4:30 pm that has gotten progressively worse.   Allergies Allergies  Allergen Reactions  . Azithromycin Rash    Level of Care/Admitting Diagnosis ED Disposition    ED Disposition Condition [facility] Area: [place] [100120]  Level of Care: Med-Surg [16]  Covid Evaluation: Asymptomatic Screening Protocol (No Symptoms)  Diagnosis: Appendicitis [id number]  Admitting Physician: Jules Husbands [id number]  Attending Physician: Jules Husbands [id number]  PT Class (Do Not Modify): Observation [104]  PT Acc Code (Do Not Modify): Observation [10022]       B Medical/Surgery History Past Medical History:  Diagnosis Date  . Hypercholesterolemia    Past Surgical History:  Procedure Laterality Date  . COLONOSCOPY N/A 09/18/2013   Procedure: COLONOSCOPY;  Surgeon: Garlan Fair, MD;  Location: [facility];  Service: Endoscopy;  Laterality: N/A;  . FRACTURE SURGERY Left    ankle  . HEMORRHOID SURGERY    . TONSILLECTOMY    . VASECTOMY       A IV Location/Drains/Wounds Patient Lines/Drains/Airways Status   Active Line/Drains/Airways    Name:   Placement date:   Placement time:   Site:   Days:   Peripheral IV 06/30/19 Right Antecubital   06/30/19    0135    Antecubital   less than 1          Intake/Output Last 24 hours No intake or output data in the 24 hours ending 06/30/19 0302  Labs/Imaging Results for orders placed or performed during the hospital encounter of 06/30/19 (from the past 48 hour(s))  Lipase, blood     Status: None   Collection  Time: 06/29/19  9:12 PM  Result Value Ref Range   Lipase 16 11 - 51 U/L    Comment: Performed at [facility], [address] [place], [place] [address]  Comprehensive metabolic panel     Status: Abnormal   Collection Time: 06/29/19  9:12 PM  Result Value Ref Range   Sodium 138 135 - 145 mmol/L   Potassium 3.5 3.5 - 5.1 mmol/L   Chloride 103 98 - 111 mmol/L   CO2 23 22 - 32 mmol/L   Glucose, Bld 127 (H) 70 - 99 mg/dL   BUN 17 8 - 23 mg/dL   Creatinine, Ser 1.06 0.61 - 1.24 mg/dL   Calcium 9.0 8.9 - 10.3 mg/dL   Total Protein 7.2 6.5 - 8.1 g/dL   Albumin 4.4 3.5 - 5.0 g/dL   AST 24 15 - 41 U/L   ALT 24 0 - 44 U/L   Alkaline Phosphatase 48 38 - 126 U/L   Total Bilirubin 1.1 0.3 - 1.2 mg/dL   GFR calc non Af Amer >60 >60 mL/min   GFR calc Af Amer >60 >60 mL/min   Anion gap 12 5 - 15    Comment: Performed at [facility], [address] [place], [place] [address]  CBC     Status: Abnormal   Collection Time: 06/29/19  9:12 PM  Result Value Ref Range   WBC 12.6 (H) 4.0 - 10.5  K/uL   RBC 4.84 4.22 - 5.81 MIL/uL   Hemoglobin 15.0 13.0 - 17.0 g/dL   HCT 43.7 39.0 - 52.0 %   MCV 90.3 80.0 - 100.0 fL   MCH 31.0 26.0 - 34.0 pg   MCHC 34.3 30.0 - 36.0 g/dL   RDW 12.9 11.5 - 15.5 %   Platelets 225 150 - 400 K/uL   nRBC 0.0 0.0 - 0.2 %    Comment: Performed at [facility], [address] [place], [place] [address]  Urinalysis, Complete w Microscopic     Status: Abnormal   Collection Time: 06/30/19 12:31 AM  Result Value Ref Range   Color, Urine YELLOW (A) YELLOW   APPearance CLEAR (A) CLEAR   Specific Gravity, Urine 1.024 1.005 - 1.030   pH 5.0 5.0 - 8.0   Glucose, UA NEGATIVE NEGATIVE mg/dL   Hgb urine dipstick MODERATE (A) NEGATIVE   Bilirubin Urine NEGATIVE NEGATIVE   Ketones, ur 20 (A) NEGATIVE mg/dL   Protein, ur NEGATIVE NEGATIVE mg/dL   Nitrite NEGATIVE NEGATIVE   Leukocytes,Ua NEGATIVE NEGATIVE   RBC / HPF 0-5 0 - 5 RBC/hpf   WBC, UA  0-5 0 - 5 WBC/hpf   Bacteria, UA NONE SEEN NONE SEEN   Squamous Epithelial / LPF NONE SEEN 0 - 5   Mucus PRESENT     Comment: Performed at [facility], [address], [place], [place] [address]   Ct Abdomen Pelvis W Contrast  Result Date: 06/30/2019 CLINICAL DATA:  Right lower quadrant abdominal pain. EXAM: CT ABDOMEN AND PELVIS WITH CONTRAST TECHNIQUE: Multidetector CT imaging of the abdomen and pelvis was performed using the standard protocol following bolus administration of intravenous contrast. Interrupted scan/bolus timing due to patient movement. CONTRAST:  126mL OMNIPAQUE IOHEXOL 300 MG/ML  SOLN COMPARISON:  None. FINDINGS: Lower chest: Lung bases are clear. Hepatobiliary: 12 mm hypodensity in the right lobe of the liver is likely cyst or hemangioma. No suspicious hepatic lesion. Gallbladder physiologically distended, no calcified stone. No biliary dilatation. Pancreas: No ductal dilatation or inflammation. Spleen: Normal in size without focal abnormality. Adrenals/Urinary Tract: Normal adrenal glands. No hydronephrosis or perinephric edema. Homogeneous renal enhancement with symmetric excretion on delayed phase imaging. Urinary bladder is physiologically distended, mild diffuse bladder wall thickening. Stomach/Bowel: Small hiatal hernia. Stomach is nondistended. No small bowel obstruction or inflammatory change. Patulous terminal ileum. The appendix is dilated measuring 11 mm in fluid-filled. Mild appendiceal wall enhancement with periappendiceal stranding. No perforation or abscess. Moderate stool burden in the colon. Diverticulosis of the descending and sigmoid colon without acute diverticulitis. Vascular/Lymphatic: Mild aorta bi-iliac atherosclerosis. Portal vein is patent. No bulky abdominopelvic adenopathy. Reproductive: Prominent prostate gland spanning 4.8 cm. Other: Fat in the left greater than right inguinal canal. No free air, free fluid, or intra-abdominal fluid collection.  Musculoskeletal: Transitional lumbosacral anatomy. There are no acute or suspicious osseous abnormalities. IMPRESSION: 1. Uncomplicated acute appendicitis. 2. Colonic diverticulosis without acute inflammation. Aortic Atherosclerosis (ICD10-I70.0). Electronically Signed   By: Keith Rake M.D.   On: 06/30/2019 02:11    Pending Labs Unresulted Labs (From admission, onward)    Start     Ordered   06/30/19 0253  SARS Coronavirus 2 [facility] order, Performed in [facility] hospital lab) Nasopharyngeal Nasopharyngeal Swab  (Symptomatic/High Risk of Exposure/Tier 1 Patients Labs with Precautions)  Once,   STAT    Question Answer Comment  Is this test for diagnosis or screening Screening   Symptomatic for COVID-19 as defined  by CDC Unknown   Hospitalized for COVID-19 Unknown   Admitted to ICU for COVID-19 Unknown   Previously tested for COVID-19 Unknown   Resident in a congregate (group) care setting Unknown   Employed in healthcare setting Unknown      06/30/19 0252   Signed and Held  HIV antibody (Routine Testing)  Once,   R     Signed and Held   Signed and Held  Creatinine, serum  (heparin)  Once,   R    Comments: Baseline for heparin therapy IF NOT ALREADY DRAWN.    Signed and Held          Vitals/Pain Today's Vitals   06/29/19 2104 06/30/19 0030 06/30/19 0109 06/30/19 0233  BP: 136/81 116/83    Pulse: 60 88    Resp: 18 17    Temp: (!) 97.5 F (36.4 C)     TempSrc: Oral     SpO2: 100% 100%    Weight:      Height:      PainSc: 7  9  9  4      Isolation Precautions Airborne and Contact precautions  Medications Medications  piperacillin-tazobactam (ZOSYN) IVPB 3.375 g (has no administration in time range)  sodium chloride flush (NS) 0.9 % injection 3 mL (3 mLs Intravenous Given 06/30/19 0233)  sodium chloride 0.9 % bolus 1,000 mL (1,000 mLs Intravenous New Bag/Given 06/30/19 0140)  fentaNYL (SUBLIMAZE) injection 50 mcg (50 mcg Intravenous Given 06/30/19 0138)  iohexol  (OMNIPAQUE) 300 MG/ML solution 100 mL (100 mLs Intravenous Contrast Given 06/30/19 0150)    Mobility walks Low fall risk   Focused Assessments surgery     R Recommendations: See Admitting Provider Note  Report given to:   Additional Notes:

## 2019-06-30 NOTE — Anesthesia Postprocedure Evaluation (Signed)
Anesthesia Post Note  Patient: Jonathan Peters  Procedure(s) Performed: APPENDECTOMY LAPAROSCOPIC (N/A )  Patient location during evaluation: PACU Anesthesia Type: General Level of consciousness: awake and alert Pain management: pain level controlled Vital Signs Assessment: post-procedure vital signs reviewed and stable Respiratory status: spontaneous breathing, nonlabored ventilation and respiratory function stable Cardiovascular status: blood pressure returned to baseline and stable Postop Assessment: no apparent nausea or vomiting Anesthetic complications: no     Last Vitals:  Vitals:   06/30/19 1635 06/30/19 1726  BP: 93/66 107/69  Pulse: (!) 58 (!) 55  Resp: 16 20  Temp: 36.8 C 36.6 C  SpO2: 95% 97%    Last Pain:  Vitals:   06/30/19 1726  TempSrc: Oral  PainSc:                  Alphonsus Sias

## 2019-06-30 NOTE — Transfer of Care (Signed)
Immediate Anesthesia Transfer of Care Note  Patient: Jonathan Peters  Procedure(s) Performed: Procedure(s): APPENDECTOMY LAPAROSCOPIC (N/A)  Patient Location: PACU  Anesthesia Type:General  Level of Consciousness: sedated  Airway & Oxygen Therapy: Patient Spontanous Breathing and Patient connected to face mask oxygen  Post-op Assessment: Report given to RN and Post -op Vital signs reviewed and stable  Post vital signs: Reviewed and stable  Last Vitals:  Vitals:   06/30/19 0459 06/30/19 1446  BP: 117/76 117/68  Pulse: 95 69  Resp: 18 16  Temp: 37.3 C 36.4 C  SpO2: 0000000 [id number]    Complications: No apparent anesthesia complications

## 2019-06-30 NOTE — H&P (Signed)
Patient ID: Jonathan Peters, male   DOB: April 18, 1956, 63 y.o.   MRN: [id number]  HPI Jonathan Peters is a 63 y.o. male with a 24-hour history of abdominal pain.  He reports that the pain is located in the right lower quadrant yesterday was severe and now the pain has improved significantly.  The pain is sharp intermittent and without any specific alleviating or aggravating factors.  Of note he said he had similar episodes in the past and this is the third 1 but this 1 by far has been the worst and more persistent.  He did have a CT scan of the abdomen and pelvis that I have personally review showing evidence of acute appendicitis without abscess or perforation.  CBC shows a white count of 12,700 and CMP was completely normal.  He is able to perform more than 4 METS of activity without any shortness of breath or chest pain he has never had any major abdominal operation.  Does use beer on a regular basis  HPI  Past Medical History:  Diagnosis Date  . Hypercholesterolemia     Past Surgical History:  Procedure Laterality Date  . COLONOSCOPY N/A 09/18/2013   Procedure: COLONOSCOPY;  Surgeon: Garlan Fair, MD;  Location: [facility];  Service: Endoscopy;  Laterality: N/A;  . FRACTURE SURGERY Left    ankle  . HEMORRHOID SURGERY    . TONSILLECTOMY    . VASECTOMY      Family History  Problem Relation Age of Onset  . Cancer Mother   . Hypertension Father     Social History Social History   Tobacco Use  . Smoking status: Never Smoker  . Smokeless tobacco: Never Used  Substance Use Topics  . Alcohol use: Yes    Alcohol/week: 14.0 standard drinks    Types: 14 Cans of beer per week  . Drug use: No    Allergies  Allergen Reactions  . Azithromycin Rash    Current Facility-Administered Medications  Medication Dose Route Frequency Provider Last Rate Last Dose  . 0.9 %  sodium chloride infusion   Intravenous Continuous Laren Whaling, [place] F, MD 125 mL/hr at 06/30/19 0640    . heparin  injection 5,000 Units  5,000 Units Subcutaneous Q8H Brunetta Newingham F, MD      . hydrALAZINE (APRESOLINE) injection 10 mg  10 mg Intravenous Q2H PRN Gatlin Kittell F, MD      . ketorolac (TORADOL) 30 MG/ML injection 30 mg  30 mg Intravenous Q6H PRN Zowie Lundahl F, MD      . morphine 2 MG/ML injection 2 mg  2 mg Intravenous Q3H PRN Jules Husbands, MD   2 mg at 06/30/19 [id number]  . ondansetron (ZOFRAN-ODT) disintegrating tablet 4 mg  4 mg Oral Q6H PRN Keiden Deskin F, MD       Or  . ondansetron (ZOFRAN) injection 4 mg  4 mg Intravenous Q6H PRN Lira Stephen F, MD      . oxyCODONE (Oxy IR/ROXICODONE) immediate release tablet 5-10 mg  5-10 mg Oral Q4H PRN Ardell Aaronson F, MD      . pantoprazole (PROTONIX) injection 40 mg  40 mg Intravenous QHS Jocsan Mcginley F, MD      . piperacillin-tazobactam (ZOSYN) IVPB 3.375 g  3.375 g Intravenous Q8H Jules Husbands, MD   Stopped at 06/30/19 0737  . prochlorperazine (COMPAZINE) tablet 10 mg  10 mg Oral Q6H PRN Jules Husbands, MD       Or  .  prochlorperazine (COMPAZINE) injection 5-10 mg  5-10 mg Intravenous Q6H PRN Primrose Oler F, MD         Review of Systems Full ROS  was asked and was negative except for the information on the HPI  Physical Exam Blood pressure 117/76, pulse 95, temperature 99.1 F (37.3 C), temperature source Oral, resp. rate 18, height 5\' 7"  (1.702 m), weight 81.6 kg, SpO2 97 %. CONSTITUTIONAL: NAD EYES: Pupils are equal, round, and reactive to light, Sclera are non-icteric. EARS, NOSE, MOUTH AND THROAT: The oropharynx is clear. The oral mucosa is pink and moist. Hearing is intact to voice. LYMPH NODES:  Lymph nodes in the neck are normal. RESPIRATORY:  Lungs are clear. There is normal respiratory effort, with equal breath sounds bilaterally, and without pathologic use of accessory muscles. CARDIOVASCULAR: Heart is regular without murmurs, gallops, or rubs. GI: The abdomen is soft, non distended, tender to palpation RLQ, no peritonitis or rebound.  There are no palpable masses. There is no hepatosplenomegaly. There are normal bowel sounds in all quadrants. GU: Rectal deferred.   MUSCULOSKELETAL: Normal muscle strength and tone. No cyanosis or edema.   SKIN: Turgor is good and there are no pathologic skin lesions or ulcers. NEUROLOGIC: Motor and sensation is grossly normal. Cranial nerves are grossly intact. PSYCH:  Oriented to person, place and time. Affect is normal.  Data Reviewed  I have personally reviewed the patient's imaging, laboratory findings and medical records.    Assessment/Plan 63 year old male with right lower quadrant pain consistent with appendicitis and confirmed by CT.  Discussed with the patient in detail and I do think that he is better served with appendectomy. The risks, benefits, complications, treatment options, and expected outcomes were discussed with the patient. The treatment of antibiotics alone was discussed giving a 20% chance that this could fail  In the acute setting and surgery would be necessary ( recurrence rate at 2 years of 40%).  Also discussed continuing to the operating room for Laparoscopic Appendectomy.  The possibilities of  bleeding, recurrent infection, perforation of viscus, finding a normal appendix, the need for additional procedures, failure to diagnose a condition, conversion to open procedure and creating a complication requiring transfusion or further operations were discussed. The patient was given the opportunity to ask questions and have them answered.  Patient would like to proceed with Laparoscopic Appendectomy and consent was obtained.  Has already started on appropriate antibiotics and is scheduled for surgery today as or schedule allows.  Caroleen Hamman, MD FACS General Surgeon 06/30/2019, 10:10 AM

## 2019-06-30 NOTE — ED Notes (Signed)
Xray confirms appy. Md at bedside to discuss further care. Patient awaiting surgery consult.

## 2019-06-30 NOTE — Anesthesia Preprocedure Evaluation (Addendum)
Anesthesia Evaluation  Patient identified by MRN, date of birth, ID band Patient awake    Reviewed: Allergy & Precautions, H&P , NPO status , reviewed documented beta blocker date and time   Airway Mallampati: II  TM Distance: >3 FB Neck ROM: full    Dental  (+) Caps, Teeth Intact   Pulmonary neg pulmonary ROS,    Pulmonary exam normal        Cardiovascular negative cardio ROS Normal cardiovascular exam     Neuro/Psych negative neurological ROS  negative psych ROS   GI/Hepatic negative GI ROS, Neg liver ROS, neg GERD  ,  Endo/Other  negative endocrine ROS  Renal/GU      Musculoskeletal   Abdominal   Peds  Hematology negative hematology ROS (+)   Anesthesia Other Findings Past Medical History: No date: Hypercholesterolemia  PSH: Colonoscopy T&A  No problems w anesthesia  Reproductive/Obstetrics                          Anesthesia Physical Anesthesia Plan  ASA: II and emergent  Anesthesia Plan: General   Post-op Pain Management:    Induction: Intravenous  PONV Risk Score and Plan: Ondansetron and Treatment may vary due to age or medical condition  Airway Management Planned: Oral ETT  Additional Equipment:   Intra-op Plan:   Post-operative Plan: Extubation in OR  Informed Consent: I have reviewed the patients History and Physical, chart, labs and discussed the procedure including the risks, benefits and alternatives for the proposed anesthesia with the patient or authorized representative who has indicated his/her understanding and acceptance.     Dental Advisory Given  Plan Discussed with: CRNA  Anesthesia Plan Comments:         Anesthesia Quick Evaluation

## 2019-06-30 NOTE — Progress Notes (Signed)
Patient had appendectomy this afternoon and is recovering well. Systolic BP was low when patient returned from surgery but has been steadily improving. Will continue to monitor.  06/30/2019 5:33 PM Marry Guan, RN

## 2019-06-30 NOTE — Anesthesia Post-op Follow-up Note (Signed)
Anesthesia QCDR form completed.        

## 2019-06-30 NOTE — ED Notes (Signed)
Assumed care of patient reports abd pain that began about 1600 today, denied f/c/d/n/v. States last bm today.  Wife at bedside. Vss, urine specimen sent. Awaiting md eval and plan of care.

## 2019-07-01 ENCOUNTER — Encounter: Payer: Self-pay | Admitting: Surgery

## 2019-07-01 MED ORDER — HYDROCODONE-ACETAMINOPHEN 5-325 MG PO TABS: 1.0000 | ORAL_TABLET | Freq: Four times a day (QID) | ORAL | 0 refills | Status: DC | PRN

## 2019-07-01 NOTE — Discharge Summary (Signed)
Patient ID: Jonathan Peters MRN: [id number] DOB/AGE: Mar 18, 1956 63 y.o.  Admit date: 06/30/2019 Discharge date: 07/01/2019   Discharge Diagnoses:  Active Problems:   Appendicitis   Procedures:lap appy  Hospital Course:   He was admitted with findings consistent with acute appendicitis and  was taken promptly to the operating room for an uneventful laparoscopic appendectomy.  Patient was kept overnight.  The time of discharge the patient was ambulating,  pain was controlled.  Her vital signs were stable and she was afebrile.   physical exam at discharge showed a pt  in no acute distress.  Awake and alert.  Abdomen: Soft incisions healing well without infection or peritonitis.  Extremities well-perfused and no edema.  Condition of the patient the time of discharge was stable     Disposition: Discharge disposition: 01-Home or Self Care       Discharge Instructions    Call MD for:  difficulty breathing, headache or visual disturbances   Complete by: As directed    Call MD for:  extreme fatigue   Complete by: As directed    Call MD for:  hives   Complete by: As directed    Call MD for:  persistant dizziness or light-headedness   Complete by: As directed    Call MD for:  persistant nausea and vomiting   Complete by: As directed    Call MD for:  redness, tenderness, or signs of infection (pain, swelling, redness, odor or green/yellow discharge around incision site)   Complete by: As directed    Call MD for:  severe uncontrolled pain   Complete by: As directed    Call MD for:  temperature >100.4   Complete by: As directed    Diet - low sodium heart healthy   Complete by: As directed    Discharge instructions   Complete by: As directed    Shower tomorrow   Increase activity slowly   Complete by: As directed    Lifting restrictions   Complete by: As directed    20 lbs x 6 wks     Allergies as of 07/01/2019      Reactions   Azithromycin Rash      Medication List     TAKE these medications   aspirin 81 MG tablet Take 81 mg by mouth daily.   azithromycin 250 MG tablet Commonly known as: Zithromax Z-Pak Take as directed on pack Notes to patient: Follow up with PCP regarding need for this medication.   chlorpheniramine-HYDROcodone 10-8 MG/5ML Lqcr Commonly known as: Tussionex Pennkinetic ER Take 5 mLs by mouth every 12 (twelve) hours as needed for cough.   cholecalciferol 1000 units tablet Commonly known as: VITAMIN D Take 1,000 Units by mouth daily.   ezetimibe 10 MG tablet Commonly known as: ZETIA Take 10 mg by mouth daily.   HYDROcodone-acetaminophen 5-325 MG tablet Commonly known as: NORCO/VICODIN Take 1-2 tablets by mouth every 6 (six) hours as needed for moderate pain.   losartan 50 MG tablet Commonly known as: COZAAR Take 1 tablet by mouth daily.   multivitamin tablet Take 1 tablet by mouth daily.   omega-3 acid ethyl esters 1 g capsule Commonly known as: LOVAZA Take 1 g by mouth daily.      Follow-up Information    Tylene Fantasia, PA-C Follow up in 2 week(s).   Specialty: Physician Assistant Contact information: [address] [place] [address] [phone]  Caroleen Hamman, MD FACS

## 2019-07-01 NOTE — Progress Notes (Signed)
07/01/2019 Marry Guan, RN 12:04 PM  Annamary Rummage to be D/C'd Home per MD order.  Discussed prescriptions and follow up appointments with the patient. Prescriptions given to patient, medication list explained in detail. Pt verbalized understanding.  Allergies as of 07/01/2019      Reactions   Azithromycin Rash      Medication List    TAKE these medications   aspirin 81 MG tablet Take 81 mg by mouth daily.   azithromycin 250 MG tablet Commonly known as: Zithromax Z-Pak Take as directed on pack Notes to patient: Follow up with PCP regarding need for this medication.   chlorpheniramine-HYDROcodone 10-8 MG/5ML Lqcr Commonly known as: Tussionex Pennkinetic ER Take 5 mLs by mouth every 12 (twelve) hours as needed for cough.   cholecalciferol 1000 units tablet Commonly known as: VITAMIN D Take 1,000 Units by mouth daily.   ezetimibe 10 MG tablet Commonly known as: ZETIA Take 10 mg by mouth daily.   HYDROcodone-acetaminophen 5-325 MG tablet Commonly known as: NORCO/VICODIN Take 1-2 tablets by mouth every 6 (six) hours as needed for moderate pain.   losartan 50 MG tablet Commonly known as: COZAAR Take 1 tablet by mouth daily.   multivitamin tablet Take 1 tablet by mouth daily.   omega-3 acid ethyl esters 1 g capsule Commonly known as: LOVAZA Take 1 g by mouth daily.       Vitals:   07/01/19 0522 07/01/19 0527  BP:  98/61  Pulse: (!) 52 (!) 51  Resp:  20  Temp:  98.5 F (36.9 C)  SpO2: 99% 95%    Skin clean, dry and intact without evidence of skin break down, no evidence of skin tears noted. IV catheter discontinued intact. Site without signs and symptoms of complications. Dressing and pressure applied. Pt denies pain at this time. No complaints noted.  An After Visit Summary was printed and given to the patient. Patient escorted via [place], and D/C home via private auto.  Marry Guan

## 2019-07-02 DIAGNOSIS — K5900 Constipation, unspecified: Secondary | ICD-10-CM | POA: Diagnosis not present

## 2019-07-02 DIAGNOSIS — K649 Unspecified hemorrhoids: Secondary | ICD-10-CM | POA: Diagnosis not present

## 2019-07-03 LAB — SURGICAL PATHOLOGY

## 2019-07-03 LAB — HIV ANTIBODY (ROUTINE TESTING W REFLEX): HIV Screen 4th Generation wRfx: NONREACTIVE

## 2019-07-16 ENCOUNTER — Telehealth: Payer: Self-pay

## 2019-07-16 NOTE — Telephone Encounter (Signed)
Patient called to give status report, post op Laparoscopic appendectomy on 06/30/19. He reports that he is doing very well. No problems with eating or using the bathroom. No nausea, pain, fever, or chills. He returned to work last week and has not had any problems with that. Patient instructed to call us with any questions or any further problems. Dr Dahlia Byes notified of patient status.

## 2019-11-27 DIAGNOSIS — Z7689 Persons encountering health services in other specified circumstances: Secondary | ICD-10-CM | POA: Diagnosis not present

## 2019-11-27 DIAGNOSIS — M170 Bilateral primary osteoarthritis of knee: Secondary | ICD-10-CM | POA: Diagnosis not present

## 2019-11-29 DIAGNOSIS — E663 Overweight: Secondary | ICD-10-CM | POA: Diagnosis not present

## 2019-11-29 DIAGNOSIS — M1712 Unilateral primary osteoarthritis, left knee: Secondary | ICD-10-CM | POA: Diagnosis not present

## 2019-11-29 DIAGNOSIS — I10 Essential (primary) hypertension: Secondary | ICD-10-CM | POA: Diagnosis not present

## 2019-11-29 DIAGNOSIS — E7800 Pure hypercholesterolemia, unspecified: Secondary | ICD-10-CM | POA: Diagnosis not present

## 2019-12-04 DIAGNOSIS — G8918 Other acute postprocedural pain: Secondary | ICD-10-CM | POA: Diagnosis not present

## 2019-12-04 DIAGNOSIS — Z96652 Presence of left artificial knee joint: Secondary | ICD-10-CM | POA: Diagnosis not present

## 2019-12-04 DIAGNOSIS — M1712 Unilateral primary osteoarthritis, left knee: Secondary | ICD-10-CM | POA: Diagnosis not present

## 2019-12-06 DIAGNOSIS — M25562 Pain in left knee: Secondary | ICD-10-CM | POA: Diagnosis not present

## 2019-12-06 DIAGNOSIS — M25662 Stiffness of left knee, not elsewhere classified: Secondary | ICD-10-CM | POA: Diagnosis not present

## 2019-12-10 DIAGNOSIS — M25662 Stiffness of left knee, not elsewhere classified: Secondary | ICD-10-CM | POA: Diagnosis not present

## 2019-12-10 DIAGNOSIS — M25562 Pain in left knee: Secondary | ICD-10-CM | POA: Diagnosis not present

## 2019-12-12 DIAGNOSIS — M25562 Pain in left knee: Secondary | ICD-10-CM | POA: Diagnosis not present

## 2019-12-12 DIAGNOSIS — M25662 Stiffness of left knee, not elsewhere classified: Secondary | ICD-10-CM | POA: Diagnosis not present

## 2019-12-14 DIAGNOSIS — M25662 Stiffness of left knee, not elsewhere classified: Secondary | ICD-10-CM | POA: Diagnosis not present

## 2019-12-14 DIAGNOSIS — M25562 Pain in left knee: Secondary | ICD-10-CM | POA: Diagnosis not present

## 2019-12-17 DIAGNOSIS — Z96652 Presence of left artificial knee joint: Secondary | ICD-10-CM | POA: Insufficient documentation

## 2019-12-17 DIAGNOSIS — M25562 Pain in left knee: Secondary | ICD-10-CM | POA: Diagnosis not present

## 2019-12-17 DIAGNOSIS — M25662 Stiffness of left knee, not elsewhere classified: Secondary | ICD-10-CM | POA: Diagnosis not present

## 2019-12-21 DIAGNOSIS — M25662 Stiffness of left knee, not elsewhere classified: Secondary | ICD-10-CM | POA: Diagnosis not present

## 2019-12-21 DIAGNOSIS — M25562 Pain in left knee: Secondary | ICD-10-CM | POA: Diagnosis not present

## 2019-12-24 DIAGNOSIS — M25662 Stiffness of left knee, not elsewhere classified: Secondary | ICD-10-CM | POA: Diagnosis not present

## 2019-12-24 DIAGNOSIS — M25562 Pain in left knee: Secondary | ICD-10-CM | POA: Diagnosis not present

## 2019-12-26 DIAGNOSIS — M25662 Stiffness of left knee, not elsewhere classified: Secondary | ICD-10-CM | POA: Diagnosis not present

## 2019-12-26 DIAGNOSIS — M25562 Pain in left knee: Secondary | ICD-10-CM | POA: Diagnosis not present

## 2019-12-27 DIAGNOSIS — M25662 Stiffness of left knee, not elsewhere classified: Secondary | ICD-10-CM | POA: Diagnosis not present

## 2019-12-27 DIAGNOSIS — M25562 Pain in left knee: Secondary | ICD-10-CM | POA: Diagnosis not present

## 2019-12-28 ENCOUNTER — Ambulatory Visit: Payer: Federal, State, Local not specified - PPO | Attending: Internal Medicine

## 2019-12-28 DIAGNOSIS — Z23 Encounter for immunization: Secondary | ICD-10-CM

## 2019-12-28 NOTE — Progress Notes (Signed)
   Covid-19 Vaccination Clinic  Name:  Jonathan Peters    MRN: [id number] DOB: 10-01-1956  12/28/2019  [name] was observed post Covid-19 immunization for 15 minutes without incident. He was provided with Vaccine Information Sheet and instruction to access the V-Safe system.   [name] was instructed to call 911 with any severe reactions post vaccine: Marland Kitchen Difficulty breathing  . Swelling of face and throat  . A fast heartbeat  . A bad rash all over body  . Dizziness and weakness   Immunizations Administered    Name Date Dose VIS Date Route   Pfizer COVID-19 Vaccine 12/28/2019  9:09 AM 0.3 mL 09/28/2019 Intramuscular   Manufacturer: [place]   Lot: [id number]   [place]: [id number]

## 2019-12-31 DIAGNOSIS — M25662 Stiffness of left knee, not elsewhere classified: Secondary | ICD-10-CM | POA: Diagnosis not present

## 2019-12-31 DIAGNOSIS — M25562 Pain in left knee: Secondary | ICD-10-CM | POA: Diagnosis not present

## 2020-01-02 DIAGNOSIS — M25662 Stiffness of left knee, not elsewhere classified: Secondary | ICD-10-CM | POA: Diagnosis not present

## 2020-01-02 DIAGNOSIS — M25562 Pain in left knee: Secondary | ICD-10-CM | POA: Diagnosis not present

## 2020-01-04 DIAGNOSIS — M25662 Stiffness of left knee, not elsewhere classified: Secondary | ICD-10-CM | POA: Diagnosis not present

## 2020-01-04 DIAGNOSIS — M25562 Pain in left knee: Secondary | ICD-10-CM | POA: Diagnosis not present

## 2020-01-07 DIAGNOSIS — M25662 Stiffness of left knee, not elsewhere classified: Secondary | ICD-10-CM | POA: Diagnosis not present

## 2020-01-07 DIAGNOSIS — M25562 Pain in left knee: Secondary | ICD-10-CM | POA: Diagnosis not present

## 2020-01-08 DIAGNOSIS — Z96652 Presence of left artificial knee joint: Secondary | ICD-10-CM | POA: Diagnosis not present

## 2020-01-08 DIAGNOSIS — Z471 Aftercare following joint replacement surgery: Secondary | ICD-10-CM | POA: Diagnosis not present

## 2020-01-09 DIAGNOSIS — M25662 Stiffness of left knee, not elsewhere classified: Secondary | ICD-10-CM | POA: Diagnosis not present

## 2020-01-09 DIAGNOSIS — M25562 Pain in left knee: Secondary | ICD-10-CM | POA: Diagnosis not present

## 2020-01-11 DIAGNOSIS — M25662 Stiffness of left knee, not elsewhere classified: Secondary | ICD-10-CM | POA: Diagnosis not present

## 2020-01-11 DIAGNOSIS — M25562 Pain in left knee: Secondary | ICD-10-CM | POA: Diagnosis not present

## 2020-01-14 DIAGNOSIS — M25662 Stiffness of left knee, not elsewhere classified: Secondary | ICD-10-CM | POA: Diagnosis not present

## 2020-01-14 DIAGNOSIS — M25562 Pain in left knee: Secondary | ICD-10-CM | POA: Diagnosis not present

## 2020-01-16 DIAGNOSIS — M25562 Pain in left knee: Secondary | ICD-10-CM | POA: Diagnosis not present

## 2020-01-16 DIAGNOSIS — M25662 Stiffness of left knee, not elsewhere classified: Secondary | ICD-10-CM | POA: Diagnosis not present

## 2020-01-18 DIAGNOSIS — M25662 Stiffness of left knee, not elsewhere classified: Secondary | ICD-10-CM | POA: Diagnosis not present

## 2020-01-18 DIAGNOSIS — M25562 Pain in left knee: Secondary | ICD-10-CM | POA: Diagnosis not present

## 2020-01-21 DIAGNOSIS — K5900 Constipation, unspecified: Secondary | ICD-10-CM | POA: Diagnosis not present

## 2020-01-21 DIAGNOSIS — K649 Unspecified hemorrhoids: Secondary | ICD-10-CM | POA: Diagnosis not present

## 2020-01-22 ENCOUNTER — Ambulatory Visit: Payer: Federal, State, Local not specified - PPO | Attending: Internal Medicine

## 2020-01-22 DIAGNOSIS — Z23 Encounter for immunization: Secondary | ICD-10-CM

## 2020-01-22 NOTE — Progress Notes (Signed)
   Covid-19 Vaccination Clinic  Name:  Jonathan Peters    MRN: [id number] DOB: 12-01-1955  01/22/2020  Jonathan Peters was observed post Covid-19 immunization for 15 minutes without incident. He was provided with Vaccine Information Sheet and instruction to access the V-Safe system.   Jonathan Peters was instructed to call 911 with any severe reactions post vaccine: Marland Kitchen Difficulty breathing  . Swelling of face and throat  . A fast heartbeat  . A bad rash all over body  . Dizziness and weakness   Immunizations Administered    Name Date Dose VIS Date Route   Pfizer COVID-19 Vaccine 01/22/2020  9:12 AM 0.3 mL 09/28/2019 Intramuscular   Manufacturer: [place]   Lot: [id number]   [place]: [id number]

## 2020-01-29 DIAGNOSIS — K5900 Constipation, unspecified: Secondary | ICD-10-CM | POA: Diagnosis not present

## 2020-01-29 DIAGNOSIS — E7800 Pure hypercholesterolemia, unspecified: Secondary | ICD-10-CM | POA: Diagnosis not present

## 2020-01-29 DIAGNOSIS — Z0000 Encounter for general adult medical examination without abnormal findings: Secondary | ICD-10-CM | POA: Diagnosis not present

## 2020-01-29 DIAGNOSIS — Z125 Encounter for screening for malignant neoplasm of prostate: Secondary | ICD-10-CM | POA: Diagnosis not present

## 2020-02-11 DIAGNOSIS — M79672 Pain in left foot: Secondary | ICD-10-CM | POA: Diagnosis not present

## 2020-02-11 DIAGNOSIS — M151 Heberden's nodes (with arthropathy): Secondary | ICD-10-CM | POA: Diagnosis not present

## 2020-02-21 DIAGNOSIS — K05322 Chronic periodontitis, generalized, moderate: Secondary | ICD-10-CM | POA: Diagnosis not present

## 2020-02-21 DIAGNOSIS — Z1281 Encounter for screening for malignant neoplasm of oral cavity: Secondary | ICD-10-CM | POA: Diagnosis not present

## 2020-02-25 DIAGNOSIS — K5900 Constipation, unspecified: Secondary | ICD-10-CM | POA: Diagnosis not present

## 2020-02-25 DIAGNOSIS — K648 Other hemorrhoids: Secondary | ICD-10-CM | POA: Diagnosis not present

## 2020-03-13 ENCOUNTER — Other Ambulatory Visit: Payer: Self-pay

## 2020-03-13 ENCOUNTER — Ambulatory Visit: Payer: Federal, State, Local not specified - PPO | Admitting: Podiatry

## 2020-03-13 ENCOUNTER — Other Ambulatory Visit: Payer: Self-pay | Admitting: Podiatry

## 2020-03-13 ENCOUNTER — Ambulatory Visit (INDEPENDENT_AMBULATORY_CARE_PROVIDER_SITE_OTHER): Payer: Federal, State, Local not specified - PPO

## 2020-03-13 VITALS — Temp 97.7°F

## 2020-03-13 DIAGNOSIS — M79672 Pain in left foot: Secondary | ICD-10-CM

## 2020-03-13 DIAGNOSIS — G5762 Lesion of plantar nerve, left lower limb: Secondary | ICD-10-CM

## 2020-03-13 DIAGNOSIS — D3610 Benign neoplasm of peripheral nerves and autonomic nervous system, unspecified: Secondary | ICD-10-CM

## 2020-03-13 NOTE — Patient Instructions (Signed)

## 2020-03-18 ENCOUNTER — Telehealth: Payer: Self-pay | Admitting: *Deleted

## 2020-03-18 NOTE — Telephone Encounter (Signed)
Patient stated that he received some foot pads from Dr March Rummage and wanted to know the sizes if they were 8 inch or a quarter of an inch. Please call.

## 2020-03-25 ENCOUNTER — Encounter (HOSPITAL_COMMUNITY): Payer: Self-pay | Admitting: Surgery

## 2020-03-25 DIAGNOSIS — K648 Other hemorrhoids: Secondary | ICD-10-CM | POA: Diagnosis not present

## 2020-03-25 DIAGNOSIS — Z86010 Personal history of colonic polyps: Secondary | ICD-10-CM | POA: Diagnosis not present

## 2020-03-25 DIAGNOSIS — K641 Second degree hemorrhoids: Secondary | ICD-10-CM | POA: Diagnosis not present

## 2020-03-25 DIAGNOSIS — K644 Residual hemorrhoidal skin tags: Secondary | ICD-10-CM | POA: Diagnosis not present

## 2020-04-04 ENCOUNTER — Other Ambulatory Visit: Payer: Self-pay

## 2020-04-04 ENCOUNTER — Ambulatory Visit: Payer: Federal, State, Local not specified - PPO | Admitting: Podiatry

## 2020-04-04 VITALS — Temp 96.9°F

## 2020-04-04 DIAGNOSIS — G5762 Lesion of plantar nerve, left lower limb: Secondary | ICD-10-CM

## 2020-04-04 NOTE — Progress Notes (Signed)
  Subjective:  Patient ID: Jonathan Peters, male    DOB: 08-23-1956,  MRN: [id number]  Chief Complaint  Patient presents with  . Follow-up    L morton's neuralgia. Pt stated, "Pain has improved. The pads seem to help initially, but the pain gets worse as the day goes on. 5/10".   64 y.o. male presents with the above complaint. History confirmed with patient.   Objective:  Physical Exam: warm, good capillary refill, no trophic changes or ulcerative lesions, normal DP and PT pulses and normal sensory exam. Left Foot: POP left 3rd interspace with Mulder's click.    Assessment:   1. Morton neuroma, left    Plan:  Patient was evaluated and treated and all questions answered.  Morton Neuroma -Repeat injection as below   Procedure: Neuroma Injection Location: Left 3rd interspace Skin Prep: Alcohol. Injectate: 0.5 cc 0.5% marcaine plain, 0.5 cc dexamethasone phosphate. Disposition: Patient tolerated procedure well. Injection site dressed with a band-aid.   No follow-ups on file.

## 2020-04-04 NOTE — Progress Notes (Signed)
  Subjective:  Patient ID: Jonathan Peters, male    DOB: 1956-08-03,  MRN: [id number]  Chief Complaint  Patient presents with  . Foot Problem    L plantar forefoot submet 1-2. x4-5 months. Pt stated, "It feels like I'm stepping on something. Not usually painful. No injuries. I hike 3 times per week, and the sensation does not get worse after a hike. PCP suspected inflammation and prescribed meloxicam, but that didn't help'.   64 y.o. male presents with the above complaint. History confirmed with patient.   Objective:  Physical Exam: warm, good capillary refill, no trophic changes or ulcerative lesions, normal DP and PT pulses and normal sensory exam. Left Foot: POP left 3rd interspace with Mulder's click.   No images are attached to the encounter.  Radiographs: X-ray of the left foot: no fracture, dislocation, swelling or degenerative changes noted Assessment:   1. Morton neuroma, left   2. Left foot pain    Plan:  Patient was evaluated and treated and all questions answered.  Morton Neuroma -Educated on etiology -Educated on padding and proper shoegear -XR reviewed with patient -Injection delivered to the affected interspaces  Procedure: Neuroma Injection Location: Left 3rd interspace Skin Prep: Alcohol. Injectate: 0.5 cc 0.5% marcaine plain, 0.5 cc dexamethasone phosphate. Disposition: Patient tolerated procedure well. Injection site dressed with a band-aid.  Return in about 3 weeks (around 04/03/2020) for Neuroma.

## 2020-04-29 ENCOUNTER — Ambulatory Visit: Payer: Self-pay | Admitting: Surgery

## 2020-04-29 DIAGNOSIS — K648 Other hemorrhoids: Secondary | ICD-10-CM | POA: Diagnosis not present

## 2020-04-29 DIAGNOSIS — K641 Second degree hemorrhoids: Secondary | ICD-10-CM | POA: Diagnosis not present

## 2020-04-29 DIAGNOSIS — K644 Residual hemorrhoidal skin tags: Secondary | ICD-10-CM | POA: Diagnosis not present

## 2020-04-29 DIAGNOSIS — Z87898 Personal history of other specified conditions: Secondary | ICD-10-CM | POA: Diagnosis not present

## 2020-04-29 NOTE — H&P (Signed)
Annamary Rummage Appointment: 04/29/2020 9:00 AM Location: [facility] Patient #: [id number] DOB: 27-Nov-1955 Unknown / Language: Cleophus Molt / Race: White Male  History of Present Illness Adin Hector MD; 04/29/2020 10:21 AM) The patient is a 64 year old male who presents with hemorrhoids. Note for "Hemorrhoids": ` ` ` Patient sent for surgical consultation at the request of Royann Shivers PA / Cannon Kettle, MD; [facility]  Chief Complaint: Worsening hemorrhoids ` ` The patient returns. He had internal and external hemorrhoids with symptoms. he wished to try banding first to see if that will control his hemorrhoids. I noted that if it did not work, I would recommend surgery. He called with concerns about his external hemorrhoids and wished to come to the office to discuss again before considering surgery. He comes in today by himself. He thinks the [place] helped a little bit internally but still struggles with irritation. He recalls having pain and urinary retention with his hemorrhoid surgery many decades ago. Seems hesitant to consider surgery and wish to talk first.   PRIOR NOTE JUN 2021: The patient is a pleasant male. History of hemorrhoids for much of his life. Recalls getting some type of hemorrhoid surgery in his late 36s. His hospital for 4 days. Thinks it was done a couple hospital but cannot remember what exactly was done.Marland Kitchen Has had issues with constipation. Seen by gastroneurology intermittently. Recently had worsening episodes of rectal bleeding. Irritation and pain. Usually gets suppositories through his primary care office. They have not been particularly helpful in the past year. Worsening symptoms. He's been more compliant with MiraLAX, Benefiber,to get his bowels more regular. Still having issues with soreness and irritation. Moves his bowels about twice in the morning. Sometimes once in the evening. Surgical consultation offered to consider  banding.  He can walk a half hour without difficulty. No smoking or diabetes. He is on 81 mg aspirin but nothing more intense. No history of cardiac or pulmonary issues. No personal nor family history of GI/colon cancer, inflammatory bowel disease, irritable bowel syndrome, allergy such as Celiac Sprue, dietary/dairy problems, colitis, ulcers nor gastritis. No recent sick contacts/gastroenteritis. No travel outside the country. No changes in diet. No dysphagia to solids or liquids. No significant heartburn or reflux. No melena, hematemesis, coffee ground emesis. No evidence of prior gastric/peptic ulceration.  (Review of systems as stated in this history (HPI) or in the review of systems. Otherwise all other 12 point ROS are negative) ` ` `  This patient encounter took 20 minutes today to perform the following: obtain history, perform exam, review outside records, interpret tests & imaging, counsel the patient on their diagnosis; and, document this encounter, including findings & plan in the electronic health record (EHR).   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 Vasectomy  Diagnostic Studies History Adin Hector, MD; 04/29/2020 8:43 AM) Colonoscopy within last year  Allergies Adin Hector, MD; 04/29/2020 8:43 AM) traMADol HCl *ANALGESICS - OPIOID*  Medication History (Alisha Spillers, CMA; 04/29/2020 8:48 AM) Losartan Potassium (50MG  Tablet, Oral) Active. Zetia (10MG  Tablet, Oral) Active. Medications Reconciled  Social History Adin Hector, MD; 04/29/2020 8:43  AM) Alcohol use Moderate alcohol use. Caffeine use Carbonated  beverages. No drug use Tobacco use Never smoker.  Family History Adin Hector, MD; 04/29/2020 8:43 AM) Cancer Mother. Hypertension Father.  Other Problems Adin Hector, MD; 04/29/2020 8:43 AM) Arthritis Back Pain Hemorrhoids High blood pressure Hypercholesterolemia     Review of Systems Adin Hector, MD; 04/29/2020 8:43 AM) General Not Present- Appetite Loss, Chills, Fatigue, Fever, Night Sweats, Weight Gain and Weight Loss. Skin Not Present- Change in Wart/Mole, Dryness, Hives, Jaundice, New Lesions, Non-Healing Wounds, Rash and Ulcer. HEENT Not Present- Earache, Hearing Loss, Hoarseness, Nose Bleed, Oral Ulcers, Ringing in the Ears, Seasonal Allergies, Sinus Pain, Sore Throat, Visual Disturbances, Wears glasses/contact lenses and Yellow Eyes. Respiratory Not Present- Bloody sputum, Chronic Cough, Difficulty Breathing, Snoring and Wheezing. Cardiovascular Not Present- Chest Pain, Difficulty Breathing Lying Down, Leg Cramps, Palpitations, Rapid Heart Rate, Shortness of Breath and Swelling of Extremities. Gastrointestinal Present- Bloating and Hemorrhoids. Not Present- Abdominal Pain, Bloody Stool, Change in Bowel Habits, Chronic diarrhea, Constipation, Difficulty Swallowing, Excessive gas, Gets full quickly at meals, Indigestion, Nausea, Rectal Pain and Vomiting. Male Genitourinary Not Present- Blood in Urine, Change in Urinary Stream, Frequency, Impotence, Nocturia, Painful Urination, Urgency and Urine Leakage. Musculoskeletal Present- Back Pain and Joint Pain. Not Present- Joint Stiffness, Muscle Pain, Muscle Weakness and Swelling of Extremities. Neurological Not Present- Decreased Memory, Fainting, Headaches, Numbness, Seizures, Tingling, Tremor, Trouble walking and Weakness. Psychiatric Not Present- Anxiety, Bipolar, Change in Sleep Pattern, Depression, Fearful and Frequent crying. Endocrine Not  Present- Cold Intolerance, Excessive Hunger, Hair Changes, Heat Intolerance and New Diabetes. Hematology Not Present- Blood Thinners, Easy Bruising, Excessive bleeding, Gland problems, HIV and Persistent Infections.  Vitals (Alisha Spillers CMA; 04/29/2020 8:48 AM) 04/29/2020 8:48 AM Weight: 180 lb Height: 67in Body Surface Area: 1.93 m Body Mass Index: 28.19 kg/m  Pulse: 74 (Regular)  BP: 138/80(Sitting, Left Arm, Standard)        Physical Exam Adin Hector MD; 04/29/2020 8:46 AM)  General Mental Status-Alert. General Appearance-Not in acute distress, Not Sickly. Orientation-Oriented X3. Hydration-Well hydrated. Voice-Normal.  Integumentary Global Assessment Upon inspection and palpation of skin surfaces of the - Axillae: non-tender, no inflammation or ulceration, no drainage. and Distribution of scalp and body hair is normal. General Characteristics Temperature - normal warmth is noted.  Head and Neck Head-normocephalic, atraumatic with no lesions or palpable masses. Face Global Assessment - atraumatic, no absence of expression. Neck Global Assessment - no abnormal movements, no bruit auscultated on the right, no bruit auscultated on the left, no decreased range of motion, non-tender. Trachea-midline. Thyroid Gland Characteristics - non-tender.  Eye Eyeball - Left-Extraocular movements intact, No Nystagmus - Left. Eyeball - Right-Extraocular movements intact, No Nystagmus - Right. Cornea - Left-No Hazy - Left. Cornea - Right-No Hazy - Right. Sclera/Conjunctiva - Left-No scleral icterus, No Discharge - Left. Sclera/Conjunctiva - Right-No scleral icterus, No Discharge - Right. Pupil - Left-Direct reaction to light normal. Pupil - Right-Direct reaction to light normal.  ENMT Ears Pinna - Left - no drainage observed, no generalized tenderness observed. Pinna - Right - no drainage observed, no generalized tenderness  observed. Nose and Sinuses External Inspection of the Nose - no destructive lesion observed. Inspection of the nares - Left - quiet respiration. Inspection of the nares - Right - quiet respiration. Mouth and Throat Lips - Upper Lip - no fissures observed, no pallor noted. Lower Lip - no fissures observed, no pallor noted. Nasopharynx - no discharge present. Oral Cavity/Oropharynx - Tongue - no dryness observed. Oral Mucosa - no cyanosis  observed. Hypopharynx - no evidence of airway distress observed.  Chest and Lung Exam Inspection Movements - Normal and Symmetrical. Accessory muscles - No use of accessory muscles in breathing. Palpation Palpation of the chest reveals - Non-tender. Auscultation Breath sounds - Normal and Clear.  Cardiovascular Auscultation Rhythm - Regular. Murmurs & Other Heart Sounds - Auscultation of the heart reveals - No Murmurs and No Systolic Clicks.  Abdomen Inspection Inspection of the abdomen reveals - No Visible peristalsis and No Abnormal pulsations. Umbilicus - No Bleeding, No Urine drainage. Palpation/Percussion Palpation and Percussion of the abdomen reveal - Soft, Non Tender, No Rebound tenderness, No Rigidity (guarding) and No Cutaneous hyperesthesia. Note: Abdomen soft. Nontender. Not distended. No umbilical or incisional hernias. No guarding.  Male Genitourinary Sexual Maturity Tanner 5 - Adult hair pattern and Adult penile size and shape. Note: No inguinal hernias. Normal external genitalia. Epididymi, testes, and spermatic cords normal without any masses.  Rectal Note: Left lateral right anterior external hemorrhoids. Mild soft tags. Not particularly inflamed.  Thin scarred ridge posterior third circumference has an arc about 2 cm from the anal verge consistent with prior hemorrhoid surgery. Enlarged internal hemorrhoids right posterior greater than left lateral greater than right anterior. At least grade 2. Suspicion of grade 3 on  right posterior but not definitely reproducible today.   Perianal skin clean with good hygiene. No pruritis ani. No pilonidal disease. No fissure. No abscess/fistula. Normal sphincter tone.  No condyloma warts. Tolerates digital and anoscopic rectal exam. Exam done with assistance of male Medical Assistant in the room.  Peripheral Vascular Upper Extremity Inspection - Left - No Cyanotic nailbeds - Left, Not Ischemic. Inspection - Right - No Cyanotic nailbeds - Right, Not Ischemic.  Neurologic Neurologic evaluation reveals -normal attention span and ability to concentrate, able to name objects and repeat phrases. Appropriate fund of knowledge , normal sensation and normal coordination. Mental Status Affect - not angry, not paranoid. Cranial Nerves-Normal Bilaterally. Gait-Normal.  Neuropsychiatric Mental status exam performed with findings of-able to articulate well with normal speech/language, rate, volume and coherence, thought content normal with ability to perform basic computations and apply abstract reasoning and no evidence of hallucinations, delusions, obsessions or homicidal/suicidal ideation.  Musculoskeletal Global Assessment Spine, Ribs and Pelvis - no instability, subluxation or laxity. Right Upper Extremity - no instability, subluxation or laxity.  Lymphatic Head & Neck General Head & Neck Lymphatics: Bilateral - Description - No Localized lymphadenopathy. Axillary General Axillary Region: Bilateral - Description - No Localized lymphadenopathy. Femoral & Inguinal Generalized Femoral & Inguinal Lymphatics: Left - Description - No Localized lymphadenopathy. Right - Description - No Localized lymphadenopathy.   Results Adin Hector MD; 04/29/2020 10:22 AM) Procedures  Name Value Date Hemorrhoids Procedure Anal exam: External Hemorrhoid Internal exam: Internal Hemorroids ( non-bleeding) Internal Hemorrhoids (bleeding) Other: Left  lateral & right anterior external hemorrhoids. Mild pruritus / irritation............ Thin scarr posterior third circumference has an arc about 2 cm from the anal verge consistent with prior hemorrhoid surgery ?PPH. finger & anoscope can past across easily w/o stricture. Enlarged internal hemorrhoids right posterior greater than left lateral greater than right anterior. At least grade 2. Suspicion of grade 3 on right posterior but not definitely reproducible today. Some scarring c/w banding last month. ...........Marland KitchenNo pilonidal disease. No fissure. No abscess/fistula. Normal sphincter tone. No condyloma warts. Tolerates digital and anoscopic rectal exam. Exam done with assistance of male Medical Assistant in the room.  Performed: 04/29/2020 9:11 AM    Assessment &  Plan Adin Hector MD; 04/29/2020 9:19 AM)  EXTERNAL HEMORRHOIDS WITH COMPLICATION (B34.1) Impression: persistent irritation of hemorrhoids despite banding. I wonder if external component is a bigger factor than initially thought.  I again recommended surgery since banding had not been sufficient. He agrees.  He did have an issue with urinary retention last hemorrhoid surgery decades ago. Will place on Flomax 2 weeks preop. Most likely will go home with Foley catheter that he can self DC in a day or 2 to minimize the risk of urinary retention.  Current Plans The anatomy & physiology of the anorectal region was discussed. The pathophysiology of hemorrhoids and differential diagnosis was discussed. Natural history risks without surgery was discussed. I stressed the importance of a bowel regimen to have daily soft bowel movements to minimize progression of disease. Interventions such as sclerotherapy & banding were discussed.  The patient's symptoms are not adequately controlled by medicines and other non-operative treatments. I feel the risks & problems of no surgery outweigh the operative risks; therefore, I  recommended surgery to treat the hemorrhoids by ligation, pexy, and possible resection.  Risks such as bleeding, infection, urinary difficulties, need for further treatment, heart attack, death, and other risks were discussed. I noted a good likelihood this will help address the problem. Goals of post-operative recovery were discussed as well. Possibility that this will not correct all symptoms was explained. Post-operative pain, bleeding, constipation, and other problems after surgery were discussed. We will work to minimize complications. Educational handouts further explaining the pathology, treatment options, and bowel regimen were given as well. Questions were answered. The patient expresses understanding & wishes to proceed with surgery.   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 He does not seem to have much prostatism but will place on Flomax 2 weeks preop. He may need to go home with a Foley catheter that he can self DC in a day or 2 to minimize the risk.  Current Plans Started Flomax 0.4 MG Oral Capsule, 1 (one) Capsule daily, #14, 14 days starting 04/29/2020, Ref. x2. Local Order: Pharmacist Notes: to shrink the prostate and help with urination  PROLAPSED INTERNAL HEMORRHOIDS, GRADE 2 (K64.1) Impression: Worsening hemorrhoids with internal bleeding and discomfort. I suspect the internal ones are more the issue. While he does have some external hemorrhoids, they don't seem particularly irritated.  I offered banding. I was able to get bands in the left lateral and right posterior aspects. He tolerated well.  If he gets some improvement in but needs repeat banding, would do more time. Otherwise if he still having symptoms despite banding, would offer outpatient hemorrhoidal ligation/pexy hemorrhoidectomy. Would have to tailor how this is done. I wonder if he had a prior PPH based on the ring  scar.  Current Plans Pt Education - [facility] Hemorrhoids (Jo-Anne Kluth): discussed with patient and provided information.  ENCOUNTER FOR PREOPERATIVE EXAMINATION FOR GENERAL SURGICAL PROCEDURE (Z01.818)  Current Plans You are being scheduled for surgery- Our schedulers will call you.  You should hear from our office's scheduling department within 5 working days about the location, date, and time of surgery. We try to make accommodations for patient's preferences in scheduling surgery, but sometimes the OR schedule or the surgeon's schedule prevents [place] from making those accommodations.  If you have not heard from our office [phone]) in 5 working days, call the office and ask for your surgeon's nurse.  If you have other questions about your  diagnosis, plan, or surgery, call the office and ask for your surgeon's nurse.  Pt Education - [facility] Rectal Prep for Anorectal outpatient/office surgery: discussed with patient and provided information. Pt Education - [facility] Rectal Surgery HCI (Alexiss Iturralde): discussed with patient and provided information.  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

## 2020-05-02 ENCOUNTER — Ambulatory Visit: Payer: Federal, State, Local not specified - PPO | Admitting: Podiatry

## 2020-05-02 ENCOUNTER — Other Ambulatory Visit: Payer: Self-pay

## 2020-05-02 DIAGNOSIS — G5762 Lesion of plantar nerve, left lower limb: Secondary | ICD-10-CM | POA: Diagnosis not present

## 2020-05-02 NOTE — Progress Notes (Signed)
  Subjective:  Patient ID: Jonathan Peters, male    DOB: 17-May-1956,  MRN: [id number]  Chief Complaint  Patient presents with  . Neuroma    Pt states improvement with injections although he is still having some pain.   64 y.o. male presents with the above complaint. History confirmed with patient. States the 2nd injection helped a lot as well. Using pads to take pressure off.  Objective:  Physical Exam: warm, good capillary refill, no trophic changes or ulcerative lesions, normal DP and PT pulses and normal sensory exam. Left Foot: POP left 3rd interspace with Mulder's click.    Assessment:   1. Morton neuroma, left    Plan:  Patient was evaluated and treated and all questions answered.  Morton Neuroma -Final injection as below -F/u PRN -Should pain persist switch to sclerosing injections  Procedure: Neuroma Injection Location: Left 3rd interspace Skin Prep: Alcohol. Injectate: 0.5 cc 0.5% marcaine plain, 0.5 cc dexamethasone phosphate. Disposition: Patient tolerated procedure well. Injection site dressed with a band-aid.   No follow-ups on file.

## 2020-05-26 ENCOUNTER — Encounter (HOSPITAL_COMMUNITY): Payer: Self-pay

## 2020-05-26 NOTE — Patient Instructions (Signed)
DUE TO COVID-19 ONLY ONE VISITOR IS ALLOWED TO COME WITH YOU AND STAY IN THE WAITING ROOM ONLY DURING PRE OP AND PROCEDURE DAY OF SURGERY. THE 1 VISITOR  MAY VISIT WITH YOU AFTER SURGERY IN YOUR PRIVATE ROOM DURING VISITING HOURS ONLY!  YOU NEED TO HAVE A COVID 19 TEST ON__8-10____ @_______ , THIS TEST MUST BE DONE BEFORE SURGERY,  COVID TESTING SITE [address] [place] [address], IT IS ON THE RIGHT GOING OUT [address] APPROXIMATELY  2 MINUTES PAST [employer] ON THE RIGHT. ONCE YOUR COVID TEST IS COMPLETED,  PLEASE BEGIN THE QUARANTINE INSTRUCTIONS AS OUTLINED IN YOUR HANDOUT.                Jonathan Peters  05/26/2020   Your procedure is scheduled on: 05-30-20   Report to [facility] Main  Entrance   Report to admitting at      0900 AM     Call this number if you have problems the morning of surgery [phone]    Remember: NO SOLID FOOD AFTER MIDNIGHT THE NIGHT PRIOR TO SURGERY. NOTHING BY MOUTH EXCEPT CLEAR LIQUIDS UNTIL   0800 am  . PLEASE FINISH ENSURE DRINK PER SURGEON ORDER  WHICH NEEDS TO BE COMPLETED AT     0800 am then nothing by mouth .     CLEAR LIQUID DIET until 0800 am then nothing by mouth   Foods Allowed                                                                         Foods Excluded  Coffee and tea, regular and decaf   No creamer                          liquids that you cannot  Plain Jell-O any favor except red or purple                                           see through such as: Fruit ices (not with fruit pulp)                                                 milk, soups, orange juice  Iced Popsicles                                                     All solid food Carbonated beverages, regular and diet                                    Cranberry, grape and apple juices Sports drinks like Gatorade Lightly seasoned clear broth or consume(fat free) Sugar, honey  syrup   _____________________________________________________________________  BRUSH YOUR TEETH MORNING OF SURGERY AND RINSE YOUR MOUTH OUT, NO CHEWING GUM CANDY OR MINTS.     Take these medicines the morning of surgery with A SIP OF WATER: flomax. zetia                               You may not have any metal on your body including hair pins and              piercings  Do not wear jewelry,  lotions, powders or perfumes, deodorant                       Men may shave face and neck.   Do not bring valuables to the hospital. [name].  Contacts, dentures or bridgework may not be worn into surgery.      Patients discharged the day of surgery will not be allowed to drive home. IF YOU ARE HAVING SURGERY AND GOING HOME THE SAME DAY, YOU MUST HAVE AN ADULT TO DRIVE YOU HOME AND BE WITH YOU FOR 24 HOURS. YOU MAY GO HOME BY TAXI OR UBER OR ORTHERWISE, BUT AN ADULT MUST ACCOMPANY YOU HOME AND STAY WITH YOU FOR 24 HOURS.  Name and phone number of your driver:  Special Instructions: N/A              Please read over the following fact sheets you were given: _____________________________________________________________________             [facility] - Preparing for Surgery Before surgery, you can play an important role.  Because skin is not sterile, your skin needs to be as free of germs as possible.  You can reduce the number of germs on your skin by washing with CHG (chlorahexidine gluconate) soap before surgery.  CHG is an antiseptic cleaner which kills germs and bonds with the skin to continue killing germs even after washing. Please DO NOT use if you have an allergy to CHG or antibacterial soaps.  If your skin becomes reddened/irritated stop using the CHG and inform your nurse when you arrive at Short Stay. Do not shave (including legs and underarms) for at least 48 hours prior to the first CHG shower.  You may shave your  face/neck. Please follow these instructions carefully:  1.  Shower with CHG Soap the night before surgery and the  morning of Surgery.  2.  If you choose to wash your hair, wash your hair first as usual with your  normal  shampoo.  3.  After you shampoo, rinse your hair and body thoroughly to remove the  shampoo.                           4.  Use CHG as you would any other liquid soap.  You can apply chg directly  to the skin and wash                       Gently with a scrungie or clean washcloth.  5.  Apply the CHG Soap to your body ONLY FROM THE NECK DOWN.   Do not use on face/ open  Wound or open sores. Avoid contact with eyes, ears mouth and genitals (private parts).                       Wash face,  Genitals (private parts) with your normal soap.             6.  Wash thoroughly, paying special attention to the area where your surgery  will be performed.  7.  Thoroughly rinse your body with warm water from the neck down.  8.  DO NOT shower/wash with your normal soap after using and rinsing off  the CHG Soap.                9.  Pat yourself dry with a clean towel.            10.  Wear clean pajamas.            11.  Place clean sheets on your bed the night of your first shower and do not  sleep with pets. Day of Surgery : Do not apply any lotions/deodorants the morning of surgery.  Please wear clean clothes to the hospital/surgery center.  FAILURE TO FOLLOW THESE INSTRUCTIONS MAY RESULT IN THE CANCELLATION OF YOUR SURGERY PATIENT SIGNATURE_________________________________  NURSE SIGNATURE__________________________________  ________________________________________________________________________

## 2020-05-26 NOTE — Progress Notes (Signed)
PCP - Seward Carol MD Cardiologist - no  PPM/ICD -  Device Orders -  Rep Notified -   Chest x-ray -  EKG - 05-27-20 Stress Test -  ECHO -  Cardiac Cath -   Sleep Study -  CPAP -   Fasting Blood Sugar -  Checks Blood Sugar _____ times a day  Blood Thinner Instructions: Aspirin Instructions:  ERAS Protcol - PRE-SURGERY Ensure or G2-   COVID TEST- 05-27-20  Activity able to walk a flight of stairs without sob and to mailbow Anesthesia review: HTN  Patient denies shortness of breath, fever, cough and chest pain at PAT appointment   none   All instructions explained to the patient, with a verbal understanding of the material. Patient agrees to go over the instructions while at home for a better understanding. Patient also instructed to self quarantine after being tested for COVID-19. The opportunity to ask questions was provided.

## 2020-05-27 ENCOUNTER — Encounter (HOSPITAL_COMMUNITY): Payer: Self-pay

## 2020-05-27 ENCOUNTER — Other Ambulatory Visit: Payer: Self-pay

## 2020-05-27 ENCOUNTER — Other Ambulatory Visit (HOSPITAL_COMMUNITY)
Admission: RE | Admit: 2020-05-27 | Discharge: 2020-05-27 | Disposition: A | Discharge status: Home / Self Care | Payer: Federal, State, Local not specified - PPO | Source: Ambulatory Visit | Attending: Surgery | Admitting: Surgery

## 2020-05-27 ENCOUNTER — Encounter (HOSPITAL_COMMUNITY)
Admission: RE | Admit: 2020-05-27 | Discharge: 2020-05-27 | Disposition: A | Discharge status: Home / Self Care | Payer: Federal, State, Local not specified - PPO | Source: Ambulatory Visit | Attending: Surgery | Admitting: Surgery

## 2020-05-27 DIAGNOSIS — Z01818 Encounter for other preprocedural examination: Secondary | ICD-10-CM | POA: Insufficient documentation

## 2020-05-27 DIAGNOSIS — Z20822 Contact with and (suspected) exposure to covid-19: Secondary | ICD-10-CM | POA: Diagnosis not present

## 2020-05-27 HISTORY — DX: Unspecified osteoarthritis, unspecified site: M19.90

## 2020-05-27 HISTORY — DX: Essential (primary) hypertension: I10

## 2020-05-27 HISTORY — DX: Benign neoplasm of peripheral nerves and autonomic nervous system of lower limb, including hip: D36.13

## 2020-05-27 LAB — BASIC METABOLIC PANEL
Anion gap: 8 (ref 5–15)
BUN: 12 mg/dL (ref 8–23)
CO2: 26 mmol/L (ref 22–32)
Calcium: 9.2 mg/dL (ref 8.9–10.3)
Chloride: 106 mmol/L (ref 98–111)
Creatinine, Ser: 1.03 mg/dL (ref 0.61–1.24)
GFR calc Af Amer: >60 mL/min (ref >60)
GFR calc non Af Amer: >60 mL/min (ref >60)
Glucose, Bld: 107 mg/dL — ABNORMAL HIGH (ref 70–99)
Potassium: 4.1 mmol/L (ref 3.5–5.1)
Sodium: 140 mmol/L (ref 135–145)

## 2020-05-27 LAB — CBC
HCT: 45 % (ref 39.0–52.0)
Hemoglobin: 14.9 g/dL (ref 13.0–17.0)
MCH: 30.6 pg (ref 26.0–34.0)
MCHC: 33.1 g/dL (ref 30.0–36.0)
MCV: 92.4 fL (ref 80.0–100.0)
Platelets: 228 10*3/uL (ref 150–400)
RBC: 4.87 MIL/uL (ref 4.22–5.81)
RDW: 14.5 % (ref 11.5–15.5)
WBC: 4.6 10*3/uL (ref 4.0–10.5)
nRBC: 0 % (ref 0.0–0.2)

## 2020-05-27 LAB — SARS CORONAVIRUS 2 (TAT 6-24 HRS): SARS Coronavirus 2: NEGATIVE

## 2020-05-29 ENCOUNTER — Encounter (HOSPITAL_COMMUNITY): Payer: Self-pay | Admitting: Surgery

## 2020-05-29 MED ORDER — BUPIVACAINE LIPOSOME 1.3 % IJ SUSP
20.0000 mL | Freq: Once | INTRAMUSCULAR | Status: DC
  Filled 2020-05-29: qty 20

## 2020-05-30 ENCOUNTER — Encounter (HOSPITAL_COMMUNITY)
Admission: RE | Disposition: A | Discharge status: Home / Self Care | Payer: Self-pay | Source: Home / Self Care | Attending: Surgery

## 2020-05-30 ENCOUNTER — Other Ambulatory Visit: Payer: Self-pay

## 2020-05-30 ENCOUNTER — Encounter (HOSPITAL_COMMUNITY): Payer: Self-pay | Admitting: Surgery

## 2020-05-30 ENCOUNTER — Ambulatory Visit (HOSPITAL_COMMUNITY): Payer: Federal, State, Local not specified - PPO | Admitting: Anesthesiology

## 2020-05-30 ENCOUNTER — Ambulatory Visit (HOSPITAL_COMMUNITY)
Admission: RE | Admit: 2020-05-30 | Discharge: 2020-05-30 | Disposition: A | Discharge status: Home / Self Care | Payer: Federal, State, Local not specified - PPO | Attending: Surgery | Admitting: Surgery

## 2020-05-30 DIAGNOSIS — M179 Osteoarthritis of knee, unspecified: Secondary | ICD-10-CM | POA: Diagnosis not present

## 2020-05-30 DIAGNOSIS — E7800 Pure hypercholesterolemia, unspecified: Secondary | ICD-10-CM | POA: Diagnosis not present

## 2020-05-30 DIAGNOSIS — K644 Residual hemorrhoidal skin tags: Secondary | ICD-10-CM | POA: Diagnosis not present

## 2020-05-30 DIAGNOSIS — K5900 Constipation, unspecified: Secondary | ICD-10-CM | POA: Insufficient documentation

## 2020-05-30 DIAGNOSIS — I10 Essential (primary) hypertension: Secondary | ICD-10-CM | POA: Diagnosis not present

## 2020-05-30 DIAGNOSIS — Z7982 Long term (current) use of aspirin: Secondary | ICD-10-CM | POA: Insufficient documentation

## 2020-05-30 DIAGNOSIS — Z79899 Other long term (current) drug therapy: Secondary | ICD-10-CM | POA: Insufficient documentation

## 2020-05-30 DIAGNOSIS — K642 Third degree hemorrhoids: Secondary | ICD-10-CM

## 2020-05-30 DIAGNOSIS — K37 Unspecified appendicitis: Secondary | ICD-10-CM | POA: Diagnosis not present

## 2020-05-30 HISTORY — PX: EVALUATION UNDER ANESTHESIA WITH HEMORRHOIDECTOMY: SHX5624

## 2020-05-30 SURGERY — EXAM UNDER ANESTHESIA WITH HEMORRHOIDECTOMY
Anesthesia: General | Site: Rectum

## 2020-05-30 MED ORDER — LACTATED RINGERS IV SOLN: INTRAVENOUS | Status: DC

## 2020-05-30 MED ORDER — DIAZEPAM 5 MG PO TABS: 5.0000 mg | ORAL_TABLET | Freq: Four times a day (QID) | ORAL | 2 refills | Status: AC | PRN

## 2020-05-30 MED ORDER — SODIUM CHLORIDE 0.9 % IV SOLN
2.0000 g | INTRAVENOUS | Status: AC
  Administered 2020-05-30: 2 g via INTRAVENOUS
  Filled 2020-05-30: qty 20

## 2020-05-30 MED ORDER — EPHEDRINE SULFATE-NACL 50-0.9 MG/10ML-% IV SOSY
PREFILLED_SYRINGE | INTRAVENOUS | Status: DC | PRN
  Administered 2020-05-30: 10 mg via INTRAVENOUS

## 2020-05-30 MED ORDER — FENTANYL CITRATE (PF) 100 MCG/2ML IJ SOLN: 25.0000 ug | INTRAMUSCULAR | Status: DC | PRN

## 2020-05-30 MED ORDER — ONDANSETRON HCL 4 MG/2ML IJ SOLN: 4.0000 mg | Freq: Once | INTRAMUSCULAR | Status: DC | PRN

## 2020-05-30 MED ORDER — ROCURONIUM BROMIDE 10 MG/ML (PF) SYRINGE
PREFILLED_SYRINGE | INTRAVENOUS | Status: DC | PRN
  Administered 2020-05-30: 60 mg via INTRAVENOUS
  Administered 2020-05-30: 10 mg via INTRAVENOUS

## 2020-05-30 MED ORDER — LIDOCAINE 2% (20 MG/ML) 5 ML SYRINGE
INTRAMUSCULAR | Status: DC | PRN
  Administered 2020-05-30: 80 mg via INTRAVENOUS

## 2020-05-30 MED ORDER — FENTANYL CITRATE (PF) 100 MCG/2ML IJ SOLN
INTRAMUSCULAR | Status: AC
  Administered 2020-05-30: 25 ug via INTRAVENOUS
  Filled 2020-05-30: qty 2

## 2020-05-30 MED ORDER — MIDAZOLAM HCL 2 MG/2ML IJ SOLN
INTRAMUSCULAR | Status: AC
  Filled 2020-05-30: qty 2

## 2020-05-30 MED ORDER — PROMETHAZINE HCL 25 MG/ML IJ SOLN: 6.2500 mg | INTRAMUSCULAR | Status: DC | PRN

## 2020-05-30 MED ORDER — FENTANYL CITRATE (PF) 100 MCG/2ML IJ SOLN
25.0000 ug | INTRAMUSCULAR | Status: DC | PRN
  Administered 2020-05-30: 50 ug via INTRAVENOUS
  Administered 2020-05-30: 25 ug via INTRAVENOUS

## 2020-05-30 MED ORDER — FENTANYL CITRATE (PF) 100 MCG/2ML IJ SOLN
INTRAMUSCULAR | Status: AC
  Filled 2020-05-30: qty 2

## 2020-05-30 MED ORDER — DIBUCAINE (PERIANAL) 1 % EX OINT
TOPICAL_OINTMENT | CUTANEOUS | Status: DC | PRN
  Administered 2020-05-30: 1 via RECTAL

## 2020-05-30 MED ORDER — GLYCOPYRROLATE PF 0.2 MG/ML IJ SOSY
PREFILLED_SYRINGE | INTRAMUSCULAR | Status: DC | PRN
  Administered 2020-05-30: .2 mg via INTRAVENOUS

## 2020-05-30 MED ORDER — SUGAMMADEX SODIUM 200 MG/2ML IV SOLN
INTRAVENOUS | Status: DC | PRN
  Administered 2020-05-30: 200 mg via INTRAVENOUS

## 2020-05-30 MED ORDER — METRONIDAZOLE IN NACL 5-0.79 MG/ML-% IV SOLN
500.0000 mg | INTRAVENOUS | Status: AC
  Administered 2020-05-30: 500 mg via INTRAVENOUS
  Filled 2020-05-30: qty 100

## 2020-05-30 MED ORDER — CELECOXIB 200 MG PO CAPS
200.0000 mg | ORAL_CAPSULE | ORAL | Status: AC
  Administered 2020-05-30: 200 mg via ORAL
  Filled 2020-05-30: qty 1

## 2020-05-30 MED ORDER — GLYCOPYRROLATE PF 0.2 MG/ML IJ SOSY
PREFILLED_SYRINGE | INTRAMUSCULAR | Status: AC
  Filled 2020-05-30: qty 5

## 2020-05-30 MED ORDER — GABAPENTIN 300 MG PO CAPS
300.0000 mg | ORAL_CAPSULE | ORAL | Status: AC
  Administered 2020-05-30: 300 mg via ORAL
  Filled 2020-05-30: qty 1

## 2020-05-30 MED ORDER — CHLORHEXIDINE GLUCONATE CLOTH 2 % EX PADS: 6.0000 | MEDICATED_PAD | Freq: Once | CUTANEOUS | Status: DC

## 2020-05-30 MED ORDER — BUPIVACAINE-EPINEPHRINE (PF) 0.5% -1:200000 IJ SOLN
INTRAMUSCULAR | Status: AC
  Filled 2020-05-30: qty 1.8

## 2020-05-30 MED ORDER — EPHEDRINE 5 MG/ML INJ
INTRAVENOUS | Status: AC
  Filled 2020-05-30: qty 10

## 2020-05-30 MED ORDER — FENTANYL CITRATE (PF) 100 MCG/2ML IJ SOLN
INTRAMUSCULAR | Status: AC
  Administered 2020-05-30: 50 ug via INTRAVENOUS
  Filled 2020-05-30: qty 2

## 2020-05-30 MED ORDER — OXYCODONE HCL 5 MG PO TABS: 5.0000 mg | ORAL_TABLET | Freq: Four times a day (QID) | ORAL | 0 refills | Status: AC | PRN

## 2020-05-30 MED ORDER — CHLORHEXIDINE GLUCONATE 0.12 % MT SOLN
15.0000 mL | Freq: Once | OROMUCOSAL | Status: AC
  Administered 2020-05-30: 15 mL via OROMUCOSAL

## 2020-05-30 MED ORDER — ROCURONIUM BROMIDE 10 MG/ML (PF) SYRINGE
PREFILLED_SYRINGE | INTRAVENOUS | Status: AC
  Filled 2020-05-30: qty 10

## 2020-05-30 MED ORDER — PROPOFOL 10 MG/ML IV BOLUS
INTRAVENOUS | Status: DC | PRN
  Administered 2020-05-30: 140 mg via INTRAVENOUS

## 2020-05-30 MED ORDER — FENTANYL CITRATE (PF) 100 MCG/2ML IJ SOLN
INTRAMUSCULAR | Status: DC | PRN
  Administered 2020-05-30: 100 ug via INTRAVENOUS

## 2020-05-30 MED ORDER — ENSURE PRE-SURGERY PO LIQD
296.0000 mL | Freq: Once | ORAL | Status: DC
  Filled 2020-05-30: qty 296

## 2020-05-30 MED ORDER — BUPIVACAINE-EPINEPHRINE 0.5% -1:200000 IJ SOLN
INTRAMUSCULAR | Status: DC | PRN
  Administered 2020-05-30: 20 mL

## 2020-05-30 MED ORDER — ONDANSETRON HCL 4 MG/2ML IJ SOLN
INTRAMUSCULAR | Status: DC | PRN
  Administered 2020-05-30: 4 mg via INTRAVENOUS

## 2020-05-30 MED ORDER — BUPIVACAINE LIPOSOME 1.3 % IJ SUSP
INTRAMUSCULAR | Status: DC | PRN
  Administered 2020-05-30: 20 mL

## 2020-05-30 MED ORDER — ACETAMINOPHEN 500 MG PO TABS
1000.0000 mg | ORAL_TABLET | ORAL | Status: AC
  Administered 2020-05-30: 1000 mg via ORAL
  Filled 2020-05-30: qty 2

## 2020-05-30 MED ORDER — MIDAZOLAM HCL 5 MG/5ML IJ SOLN
INTRAMUSCULAR | Status: DC | PRN
  Administered 2020-05-30: 2 mg via INTRAVENOUS

## 2020-05-30 MED ORDER — DEXAMETHASONE SODIUM PHOSPHATE 10 MG/ML IJ SOLN
INTRAMUSCULAR | Status: DC | PRN
  Administered 2020-05-30: 8 mg via INTRAVENOUS

## 2020-05-30 MED ORDER — ORAL CARE MOUTH RINSE: 15.0000 mL | Freq: Once | OROMUCOSAL | Status: AC

## 2020-05-30 SURGICAL SUPPLY — 35 items
BENZOIN TINCTURE PRP APPL 2/3 (GAUZE/BANDAGES/DRESSINGS) ×3 IMPLANT
BLADE SURG 15 STRL LF DISP TIS (BLADE) IMPLANT
BLADE SURG 15 STRL SS (BLADE)
BRIEF STRETCH FOR OB PAD LRG (UNDERPADS AND DIAPERS) ×3 IMPLANT
CNTNR URN SCR LID CUP LEK RST (MISCELLANEOUS) ×1 IMPLANT
CONT SPEC 4OZ STRL OR WHT (MISCELLANEOUS) ×3
COVER SURGICAL LIGHT HANDLE (MISCELLANEOUS) ×3 IMPLANT
COVER WAND RF STERILE (DRAPES) IMPLANT
DECANTER SPIKE VIAL GLASS SM (MISCELLANEOUS) ×3 IMPLANT
DRAPE LAPAROTOMY T 102X78X121 (DRAPES) ×3 IMPLANT
DRSG PAD ABDOMINAL 8X10 ST (GAUZE/BANDAGES/DRESSINGS) IMPLANT
ELECT REM PT RETURN 15FT ADLT (MISCELLANEOUS) ×3 IMPLANT
GAUZE 4X4 16PLY RFD (DISPOSABLE) ×3 IMPLANT
GAUZE SPONGE 4X4 12PLY STRL (GAUZE/BANDAGES/DRESSINGS) ×3 IMPLANT
GLOVE ECLIPSE 8.0 STRL XLNG CF (GLOVE) ×3 IMPLANT
GLOVE INDICATOR 8.0 STRL GRN (GLOVE) ×3 IMPLANT
GOWN STRL REUS W/TWL XL LVL3 (GOWN DISPOSABLE) ×6 IMPLANT
KIT BASIN OR (CUSTOM PROCEDURE TRAY) ×3 IMPLANT
KIT TURNOVER KIT A (KITS) IMPLANT
LOOP VESSEL MAXI BLUE (MISCELLANEOUS) IMPLANT
NEEDLE HYPO 22GX1.5 SAFETY (NEEDLE) ×3 IMPLANT
PACK BASIC VI WITH GOWN DISP (CUSTOM PROCEDURE TRAY) ×3 IMPLANT
PENCIL SMOKE EVACUATOR (MISCELLANEOUS) IMPLANT
SHEARS HARMONIC 9CM CVD (BLADE) IMPLANT
SURGILUBE 2OZ TUBE FLIPTOP (MISCELLANEOUS) ×3 IMPLANT
SUT CHROMIC 2 0 SH (SUTURE) ×3 IMPLANT
SUT CHROMIC 3 0 SH 27 (SUTURE) IMPLANT
SUT VIC AB 2-0 SH 27 (SUTURE)
SUT VIC AB 2-0 SH 27X BRD (SUTURE) IMPLANT
SUT VIC AB 2-0 UR6 27 (SUTURE) ×18 IMPLANT
SYR 20ML LL LF (SYRINGE) ×3 IMPLANT
SYR 3ML LL SCALE MARK (SYRINGE) IMPLANT
TOWEL OR 17X26 10 PK STRL BLUE (TOWEL DISPOSABLE) ×3 IMPLANT
TOWEL OR NON WOVEN STRL DISP B (DISPOSABLE) ×3 IMPLANT
YANKAUER SUCT BULB TIP 10FT TU (MISCELLANEOUS) ×3 IMPLANT

## 2020-05-30 NOTE — Anesthesia Procedure Notes (Signed)
Procedure Name: Intubation Date/Time: 05/30/2020 12:06 PM Performed by: Lavina Hamman, CRNA Pre-anesthesia Checklist: Patient identified, Emergency Drugs available, Suction available, Patient being monitored and Timeout performed Patient Re-evaluated:Patient Re-evaluated prior to induction Oxygen Delivery Method: Circle system utilized Preoxygenation: Pre-oxygenation with 100% oxygen Induction Type: IV induction Ventilation: Mask ventilation without difficulty Laryngoscope Size: Mac and 4 Grade View: Grade II Tube type: Oral Tube size: 7.5 mm Number of attempts: 1 Airway Equipment and Method: Stylet Placement Confirmation: ETT inserted through vocal cords under direct vision,  positive ETCO2,  CO2 detector and breath sounds checked- equal and bilateral Secured at: 22 cm Tube secured with: Tape Dental Injury: Teeth and Oropharynx as per pre-operative assessment  Comments: ATOI, teeth unchanged.

## 2020-05-30 NOTE — Anesthesia Postprocedure Evaluation (Signed)
Anesthesia Post Note  Patient: Jonathan Peters  Procedure(s) Performed: HEMORRHOIDECTOMY WITH LIGATION AND HEMORRHOIDOPEXY ANORECTAL EXAM UNDER ANESTHESIA (N/A Rectum)     Patient location during evaluation: PACU Anesthesia Type: General Level of consciousness: awake and alert, oriented and awake Pain management: pain level controlled Vital Signs Assessment: post-procedure vital signs reviewed and stable Respiratory status: spontaneous breathing, nonlabored ventilation and respiratory function stable Cardiovascular status: blood pressure returned to baseline and stable Postop Assessment: no apparent nausea or vomiting Anesthetic complications: no   No complications documented.  Last Vitals:  Vitals:   05/30/20 1445 05/30/20 1514  BP: (!) 131/92   Pulse: 68   Resp: 11   Temp: 36.5 C 36.7 C  SpO2: 100%     Last Pain:  Vitals:   05/30/20 1514  TempSrc: Oral  PainSc:                  Catalina Gravel

## 2020-05-30 NOTE — Progress Notes (Signed)
16 foley cath inserted by RN KP. Patient and wife instructed to call surgeon on Monday for further instructions regarding cath care and removal.  Pt and wife also instructed how to maintain cath care at home.

## 2020-05-30 NOTE — Op Note (Signed)
05/30/2020  12:52 PM  PATIENT:  Jonathan Peters  64 y.o. male  Patient Care Team: Seward Carol, MD as PCP - General (Internal Medicine) Michael Boston, MD as Consulting Physician (General Surgery) Wilford Corner, MD as Consulting Physician (Gastroenterology)  PRE-OPERATIVE DIAGNOSIS:  RECURRENT HEMORRHOIDS PROLAPSING GRADE 3 WITH BLEEDING AND PAIN, EXTERNAL HEMORRHOIDS  POST-OPERATIVE DIAGNOSIS:  recurrent hemorrhoids prolapsing grade 3 withbleeding and pain  PROCEDURE:  Procedure(s): HEMORRHOIDECTOMY WITH LIGATION AND HEMORRHOIDOPEXY ANORECTAL EXAM UNDER ANESTHESIA  Internal and external hemorrhoidectomy x1  External hemorrhoidectomy x 2  Internal hemorrhoidal ligation and pexy Anorectal examination under anesthesia  SURGEON:  Adin Hector, MD  ANESTHESIA:   General Anorectal & Local field block (0.25% bupivacaine with epinephrine mixed with Liposomal bupivacaine (Experel)   EBL:  Total I/O In: -  Out: 15 [Blood:15].  See operative record  Delay start of Pharmacological VTE agent (>24hrs) due to surgical blood loss or risk of bleeding:  NO  DRAINS: NONE  SPECIMEN:   Internal & external hemorrhoid x1  (left lateral) External hemorrhoid x 2 (right posterior & anterior midline)  DISPOSITION OF SPECIMEN:  PATHOLOGY  COUNTS:  YES  PLAN OF CARE: Discharge home after PACU  PATIENT DISPOSITION:  PACU - hemodynamically stable.  INDICATION: Pleasant patient with struggles with hemorrhoids.  Not able to be managed in the office despite an improved bowel regimen & prior banding.  I recommended examination under anesthesia and surgical treatment:  The anatomy & physiology of the anorectal region was discussed.  The pathophysiology of hemorrhoids and differential diagnosis was discussed.  Natural history risks without surgery was discussed.   I stressed the importance of a bowel regimen to have daily soft bowel movements to minimize progression of disease.  Interventions  such as sclerotherapy & banding were discussed.  The patient's symptoms are not adequately controlled by medicines and other non-operative treatments.  I feel the risks & problems of no surgery outweigh the operative risks; therefore, I recommended surgery to treat the hemorrhoids by ligation, pexy, and possible resection.  Risks such as bleeding, infection, need for further treatment, heart attack, death, and other risks were discussed.   I noted a good likelihood this will help address the problem.  Goals of post-operative recovery were discussed as well.  Possibility that this will not correct all symptoms was explained.  Post-operative pain, bleeding, constipation, urinary difficulties, and other problems after surgery were discussed.  We will work to minimize complications.   Educational handouts further explaining the pathology, treatment options, and bowel regimen were given as well.  Questions were answered.  The patient expresses understanding & wishes to proceed with surgery.  OR FINDINGS: Enlarged internal hemorrhoids.  Left lateral at least grade 3 with external component suspicious for grade 4.  Chronically irritated right anterior right posterior internal hemorrhoids grade 2.  Persistent external tags right posterior and anterior midline along the raphae.  No fissure or fistula.  No circumferential rectal prolapse.  No pilonidal disease.  No condyloma or tumors.  DESCRIPTION:   Informed consent was confirmed. Patient underwent general anesthesia without difficulty. Patient was placed into prone positioning.  The perianal region was prepped and draped in sterile fashion. Surgical time-out confirmed our plan.  I did digital rectal examination and then transitioned over to anoscopy to get a sense of the anatomy.  Findings noted above.   I proceeded to do hemorrhoidal ligation and pexy.  I used a 2-0 Vicryl suture on a UR-6 needle in a figure-of-eight fashion  6 cm proximal to the anal  verge.  I started at the largest hemorrhoid pile.  Because of redundant hemorrhoidal tissue too bulky to merely ligate or pexy, I excised the excess left lateral internal/external hemorrhoid pile longitudinally in a fusiform biconcave fashion, sparing the anal canal to avoid narrowing.  I then ran that stitch longitudinally more distally to close the hemorrhoidectomy wound to the anal verge over a large Parks self retaining retractor to avoid narrowing of the anal canal.  I then tied that stitch down to cause a hemorrhoidopexy.  I then did hemorrhoidal ligation and pexy at the other 5 hemorrhoidal columns.  At the completion of this, all 6 anorectal columns were ligated and pexied in the classic hexagonal fashion (right anterior/lateral/posterior, left anterior/lateral/posterior).  There is persistent external hemorrhoidal tag tissue right posterior and anterior midline.  I excised those longitudinally.  I closed the external part of the hemorrhoidectomy wounds with interrupted horizontal mattress 2-0 chromic suture, leaving the last 5 mm open to allow natural drainage.    I redid anoscopy & examination.  At completion of this, all hemorrhoids had been removed or reduced into the rectum.  There is no more prolapse.  Internal & external anatomy was more more normal.  Hemostasis was good.  Fluffed gauze was on-laid over the perianal region.  No packing done.  Patient is being extubated go to go to the recovery room.  I had discussed postop care in detail with the patient in the preop holding area.  Instructions for post-operative recovery and prescriptions are written. I discussed operative findings, updated the patient's status, discussed probable steps to recovery, and gave postoperative recommendations to the patient's spouse.  Recommendations were made.  Questions were answered.  She expressed understanding & appreciation.  Adin Hector, M.D., F.A.C.S. Gastrointestinal and Minimally Invasive  Surgery [facility] [address], [place],  [address] [phone] Main / Paging

## 2020-05-30 NOTE — H&P (Signed)
Jonathan Peters DOB: 10-12-1956 Unknown / Language: Cleophus Molt / Race: White Male  Patient Care Team: Seward Carol, MD as PCP - General (Internal Medicine) Michael Boston, MD as Consulting Physician (General Surgery) Wilford Corner, MD as Consulting Physician (Gastroenterology)  ` ` Patient sent for surgical consultation at the request of Royann Shivers PA / Cannon Kettle, MD; [facility]  Chief Complaint: Worsening hemorrhoids ` ` The patient returns. He had internal and external hemorrhoids with symptoms. he wished to try banding first to see if that will control his hemorrhoids. I noted that if it did not work, I would recommend surgery. He called with concerns about his external hemorrhoids and wished to come to the office to discuss again before considering surgery. He comes in today by himself. He thinks the [place] helped a little bit internally but still struggles with irritation. He recalls having pain and urinary retention with his hemorrhoid surgery many decades ago. Seems hesitant to consider surgery and wish to talk first.   PRIOR NOTE JUN 2021: The patient is a pleasant male. History of hemorrhoids for much of his life. Recalls getting some type of hemorrhoid surgery in his late 40s. His hospital for 4 days. Thinks it was done a couple hospital but cannot remember what exactly was done.Marland Kitchen Has had issues with constipation. Seen by gastroneurology intermittently. Recently had worsening episodes of rectal bleeding. Irritation and pain. Usually gets suppositories through his primary care office. They have not been particularly helpful in the past year. Worsening symptoms. He's been more compliant with MiraLAX, Benefiber,to get his bowels more regular. Still having issues with soreness and irritation. Moves his bowels about twice in the morning. Sometimes once in the evening. Surgical consultation offered to consider banding.  He can walk a half hour without  difficulty. No smoking or diabetes. He is on 81 mg aspirin but nothing more intense. No history of cardiac or pulmonary issues. No personal nor family history of GI/colon cancer, inflammatory bowel disease, irritable bowel syndrome, allergy such as Celiac Sprue, dietary/dairy problems, colitis, ulcers nor gastritis. No recent sick contacts/gastroenteritis. No travel outside the country. No changes in diet. No dysphagia to solids or liquids. No significant heartburn or reflux. No melena, hematemesis, coffee ground emesis. No evidence of prior gastric/peptic ulceration.  (Review of systems as stated in this history (HPI) or in the review of systems. Otherwise all other 12 point ROS are negative) ` ` `  This patient encounter took 20 minutes today to perform the following: obtain history, perform exam, review outside records, interpret tests & imaging, counsel the patient on their diagnosis; and, document this encounter, including findings & plan in the electronic health record (EHR).   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 Vasectomy  Diagnostic Studies History Adin Hector, MD; 04/29/2020 8:43 AM) Colonoscopy within last year  Allergies Adin Hector, MD; 04/29/2020 8:43 AM) traMADol HCl *ANALGESICS - OPIOID*  Medication History (Alisha Spillers, CMA; 04/29/2020 8:48 AM) Losartan Potassium (50MG  Tablet, Oral) Active. Zetia (10MG  Tablet, Oral) Active. Medications Reconciled  Social History Adin Hector, MD; 04/29/2020 8:43 AM) Alcohol use Moderate alcohol  use. Caffeine use Carbonated beverages. No drug use Tobacco use Never smoker.  Family History Adin Hector, MD; 04/29/2020 8:43 AM) Cancer Mother. Hypertension Father.  Other Problems Adin Hector, MD; 04/29/2020 8:43 AM) Arthritis Back Pain Hemorrhoids High blood pressure Hypercholesterolemia     Review of Systems Adin Hector, MD; 04/29/2020 8:43 AM) General Not Present- Appetite Loss, Chills, Fatigue, Fever, Night Sweats, Weight Gain and Weight Loss. Skin Not Present- Change in Wart/Mole, Dryness, Hives, Jaundice, New Lesions, Non-Healing Wounds, Rash and Ulcer. HEENT Not Present- Earache, Hearing Loss, Hoarseness, Nose Bleed, Oral Ulcers, Ringing in the Ears, Seasonal Allergies, Sinus Pain, Sore Throat, Visual Disturbances, Wears glasses/contact lenses and Yellow Eyes. Respiratory Not Present- Bloody sputum, Chronic Cough, Difficulty Breathing, Snoring and Wheezing. Cardiovascular Not Present- Chest Pain, Difficulty Breathing Lying Down, Leg Cramps, Palpitations, Rapid Heart Rate, Shortness of Breath and Swelling of Extremities. Gastrointestinal Present- Bloating and Hemorrhoids. Not Present- Abdominal Pain, Bloody Stool, Change in Bowel Habits, Chronic diarrhea, Constipation, Difficulty Swallowing, Excessive gas, Gets full quickly at meals, Indigestion, Nausea, Rectal Pain and Vomiting. Male Genitourinary Not Present- Blood in Urine, Change in Urinary Stream, Frequency, Impotence, Nocturia, Painful Urination, Urgency and Urine Leakage. Musculoskeletal Present- Back Pain and Joint Pain. Not Present- Joint Stiffness, Muscle Pain, Muscle Weakness and Swelling of Extremities. Neurological Not Present- Decreased Memory, Fainting, Headaches, Numbness, Seizures, Tingling, Tremor, Trouble walking and Weakness. Psychiatric Not Present- Anxiety, Bipolar, Change in Sleep Pattern, Depression, Fearful and Frequent crying. Endocrine Not Present- Cold Intolerance,  Excessive Hunger, Hair Changes, Heat Intolerance and New Diabetes. Hematology Not Present- Blood Thinners, Easy Bruising, Excessive bleeding, Gland problems, HIV and Persistent Infections.  Vitals (Alisha Spillers CMA; 04/29/2020 8:48 AM) 04/29/2020 8:48 AM Weight: 180 lb Height: 67in Body Surface Area: 1.93 m Body Mass Index: 28.19 kg/m  Pulse: 74 (Regular)  BP: 138/80(Sitting, Left Arm, Standard)   05/30/2020 BP (!) 173/93   Pulse 77   Temp 98.2 F (36.8 C) (Oral)   Resp 17   Ht 5\' 7"  (1.702 m)   Wt 83.1 kg   SpO2 100%   BMI 28.69 kg/m       Physical Exam Adin Hector MD; 04/29/2020 8:46 AM)  General Mental Status-Alert. General Appearance-Not in acute distress, Not Sickly. Orientation-Oriented X3. Hydration-Well hydrated. Voice-Normal.  Integumentary Global Assessment Upon inspection and palpation of skin surfaces of the - Axillae: non-tender, no inflammation or ulceration, no drainage. and Distribution of scalp and body hair is normal. General Characteristics Temperature - normal warmth is noted.  Head and Neck Head-normocephalic, atraumatic with no lesions or palpable masses. Face Global Assessment - atraumatic, no absence of expression. Neck Global Assessment - no abnormal movements, no bruit auscultated on the right, no bruit auscultated on the left, no decreased range of motion, non-tender. Trachea-midline. Thyroid Gland Characteristics - non-tender.  Eye Eyeball - Left-Extraocular movements intact, No Nystagmus - Left. Eyeball - Right-Extraocular movements intact, No Nystagmus - Right. Cornea - Left-No Hazy - Left. Cornea - Right-No Hazy - Right. Sclera/Conjunctiva - Left-No scleral icterus, No Discharge - Left. Sclera/Conjunctiva - Right-No scleral icterus, No Discharge - Right. Pupil - Left-Direct reaction to light normal. Pupil - Right-Direct reaction to light normal.  ENMT Ears Pinna -  Left - no drainage observed, no generalized tenderness observed. Pinna - Right - no drainage observed, no generalized tenderness observed. Nose and Sinuses External Inspection of the Nose - no destructive lesion observed. Inspection of the nares - Left - quiet respiration. Inspection of the nares - Right - quiet respiration. Mouth and Throat Lips - Upper Lip -  no fissures observed, no pallor noted. Lower Lip - no fissures observed, no pallor noted. Nasopharynx - no discharge present. Oral Cavity/Oropharynx - Tongue - no dryness observed. Oral Mucosa - no cyanosis observed. Hypopharynx - no evidence of airway distress observed.  Chest and Lung Exam Inspection Movements - Normal and Symmetrical. Accessory muscles - No use of accessory muscles in breathing. Palpation Palpation of the chest reveals - Non-tender. Auscultation Breath sounds - Normal and Clear.  Cardiovascular Auscultation Rhythm - Regular. Murmurs & Other Heart Sounds - Auscultation of the heart reveals - No Murmurs and No Systolic Clicks.  Abdomen Inspection Inspection of the abdomen reveals - No Visible peristalsis and No Abnormal pulsations. Umbilicus - No Bleeding, No Urine drainage. Palpation/Percussion Palpation and Percussion of the abdomen reveal - Soft, Non Tender, No Rebound tenderness, No Rigidity (guarding) and No Cutaneous hyperesthesia. Note: Abdomen soft. Nontender. Not distended. No umbilical or incisional hernias. No guarding.  Male Genitourinary Sexual Maturity Tanner 5 - Adult hair pattern and Adult penile size and shape. Note: No inguinal hernias. Normal external genitalia. Epididymi, testes, and spermatic cords normal without any masses.  Rectal Note: Left lateral right anterior external hemorrhoids. Mild soft tags. Not particularly inflamed.  Thin scarred ridge posterior third circumference has an arc about 2 cm from the anal verge consistent with prior hemorrhoid surgery. Enlarged  internal hemorrhoids right posterior greater than left lateral greater than right anterior. At least grade 2. Suspicion of grade 3 on right posterior but not definitely reproducible today.   Perianal skin clean with good hygiene. No pruritis ani. No pilonidal disease. No fissure. No abscess/fistula. Normal sphincter tone.  No condyloma warts. Tolerates digital and anoscopic rectal exam. Exam done with assistance of male Medical Assistant in the room.  Peripheral Vascular Upper Extremity Inspection - Left - No Cyanotic nailbeds - Left, Not Ischemic. Inspection - Right - No Cyanotic nailbeds - Right, Not Ischemic.  Neurologic Neurologic evaluation reveals -normal attention span and ability to concentrate, able to name objects and repeat phrases. Appropriate fund of knowledge , normal sensation and normal coordination. Mental Status Affect - not angry, not paranoid. Cranial Nerves-Normal Bilaterally. Gait-Normal.  Neuropsychiatric Mental status exam performed with findings of-able to articulate well with normal speech/language, rate, volume and coherence, thought content normal with ability to perform basic computations and apply abstract reasoning and no evidence of hallucinations, delusions, obsessions or homicidal/suicidal ideation.  Musculoskeletal Global Assessment Spine, Ribs and Pelvis - no instability, subluxation or laxity. Right Upper Extremity - no instability, subluxation or laxity.  Lymphatic Head & Neck General Head & Neck Lymphatics: Bilateral - Description - No Localized lymphadenopathy. Axillary General Axillary Region: Bilateral - Description - No Localized lymphadenopathy. Femoral & Inguinal Generalized Femoral & Inguinal Lymphatics: Left - Description - No Localized lymphadenopathy. Right - Description - No Localized lymphadenopathy.   Results Adin Hector MD; 04/29/2020 10:22  AM) Procedures  Name Value Date Hemorrhoids Procedure Anal exam: External Hemorrhoid Internal exam: Internal Hemorroids ( non-bleeding) Internal Hemorrhoids (bleeding) Other: Left lateral & right anterior external hemorrhoids. Mild pruritus / irritation............ Thin scarr posterior third circumference has an arc about 2 cm from the anal verge consistent with prior hemorrhoid surgery ?PPH. finger & anoscope can past across easily w/o stricture. Enlarged internal hemorrhoids right posterior greater than left lateral greater than right anterior. At least grade 2. Suspicion of grade 3 on right posterior but not definitely reproducible today. Some scarring c/w banding last month. ...........Marland KitchenNo pilonidal disease. No fissure. No abscess/fistula.  Normal sphincter tone. No condyloma warts. Tolerates digital and anoscopic rectal exam. Exam done with assistance of male Medical Assistant in the room.  Performed: 04/29/2020 9:11 AM    Assessment & Plan   EXTERNAL HEMORRHOIDS WITH COMPLICATION (P95.0) Impression: persistent irritation of hemorrhoids despite banding. I wonder if external component is a bigger factor than initially thought.  I again recommended surgery since banding had not been sufficient. He agrees.  He did have an issue with urinary retention last hemorrhoid surgery decades ago. Will place on Flomax 2 weeks preop. Most likely will go home with Foley catheter that he can self DC in a day or 2 to minimize the risk of urinary retention.   The anatomy & physiology of the anorectal region was discussed. The pathophysiology of hemorrhoids and differential diagnosis was discussed. Natural history risks without surgery was discussed. I stressed the importance of a bowel regimen to have daily soft bowel movements to minimize progression of disease. Interventions such as sclerotherapy & banding were discussed.  The patient's symptoms are  not adequately controlled by medicines and other non-operative treatments. I feel the risks & problems of no surgery outweigh the operative risks; therefore, I recommended surgery to treat the hemorrhoids by ligation, pexy, and possible resection.  Risks such as bleeding, infection, urinary difficulties, need for further treatment, heart attack, death, and other risks were discussed. I noted a good likelihood this will help address the problem. Goals of post-operative recovery were discussed as well. Possibility that this will not correct all symptoms was explained. Post-operative pain, bleeding, constipation, and other problems after surgery were discussed. We will work to minimize complications. Educational handouts further explaining the pathology, treatment options, and bowel regimen were given as well. Questions were answered. The patient expresses understanding & wishes to proceed with surgery.   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 He does not seem to have much prostatism but will place on Flomax 2 weeks preop. He may need to go home with a Foley catheter that he can self DC in a day or 2 to minimize the risk.  Current Plans Started Flomax 0.4 MG Oral Capsule, 1 (one) Capsule daily, #14, 14 days starting 04/29/2020, Ref. x2. Local Order: Pharmacist Notes: to shrink the prostate and help with urination  PROLAPSED INTERNAL HEMORRHOIDS, GRADE 2 (K64.1) Impression: Worsening hemorrhoids with internal bleeding and discomfort. I suspect the internal ones are more the issue. While he does have some external hemorrhoids, they don't seem particularly irritated.  I offered banding. I was able to get bands in the left lateral and right posterior aspects. He tolerated well.  If he gets some improvement in but needs repeat banding, would do more time. Otherwise if he still having symptoms despite  banding, would offer outpatient hemorrhoidal ligation/pexy hemorrhoidectomy. Would have to tailor how this is done. I wonder if he had a prior PPH based on the ring scar.  Current Plans Pt Education - [facility] Hemorrhoids (Daryll Spisak): discussed with patient and provided information.  ENCOUNTER FOR PREOPERATIVE EXAMINATION FOR GENERAL SURGICAL PROCEDURE (Z01.818)  Current Plans You are being scheduled for surgery- Our schedulers will call you.  You should hear from our office's scheduling department within 5 working days about the location, date, and time of surgery. We try to make accommodations for patient's preferences in scheduling surgery, but sometimes the OR schedule or the surgeon's schedule prevents [place] from making those accommodations.  If you have not  heard from our office [phone]) in 5 working days, call the office and ask for your surgeon's nurse.  If you have other questions about your diagnosis, plan, or surgery, call the office and ask for your surgeon's nurse.  Pt Education - [facility] Rectal Prep for Anorectal outpatient/office surgery: discussed with patient and provided information. Pt Education - [facility] Rectal Surgery HCI (Malyk Girouard): discussed with patient and provided information.  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 [place], [place] [address] [phone] Fax [phone] Main/Paging  CONTACT INFORMATION: Weekday (9AM-5PM) concerns: Call [facility] main office at [phone] Weeknight (5PM-9AM) or Weekend/Holiday concerns: Check [email or web address] for General Surgery [facility] coverage (Please, do not use SecureChat as it is not reliable  communication to operating surgeons for immediate patient care)

## 2020-05-30 NOTE — Interval H&P Note (Signed)
History and Physical Interval Note:  05/30/2020 10:53 AM  Jonathan Peters  has presented today for surgery, with the diagnosis of RECURRENT HEMORRHOIDS PROLAPSING GRADE 3 WITH BLEEDING AND PAIN, EXTERNAL HEMORRHOIDS.  The various methods of treatment have been discussed with the patient and family. After consideration of risks, benefits and other options for treatment, the patient has consented to  Procedure(s): HEMORRHOIDECTOMY WITH LIGATION AND HEMORRHOIDOPEXY ANORECTAL EXAM UNDER ANESTHESIA (N/A) as a surgical intervention.  The patient's history has been reviewed, patient examined, no change in status, stable for surgery.  I have reviewed the patient's chart and labs.  Questions were answered to the patient's satisfaction.    I have re-reviewed the the patient's records, history, medications, and allergies.  I have re-examined the patient.  I again discussed intraoperative plans and goals of post-operative recovery.  The patient agrees to proceed.  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   Appendicitis 06/30/2019   Osteoarthritis of knee 12/26/2017   Tubular adenoma of colon 2014    Past Medical History:  Diagnosis Date   Arthritis    knees   Hypercholesterolemia    Hypertension    Neuroma of foot     Past Surgical History:  Procedure Laterality Date   COLONOSCOPY N/A 09/18/2013   Procedure: COLONOSCOPY;  Surgeon: Garlan Fair, MD;  Location: [facility];  Service: Endoscopy;  Laterality: N/A;   FRACTURE SURGERY Left    ankle   HEMORRHOID SURGERY     LAPAROSCOPIC APPENDECTOMY N/A 06/30/2019   Procedure: APPENDECTOMY LAPAROSCOPIC;  Surgeon: Jules Husbands, MD;  Location:  [facility] ORS;  Service: General;  Laterality: N/A;   TONSILLECTOMY     VASECTOMY      Social History   Socioeconomic History   Marital status: Married    Spouse name: Not on file   Number of children: Not on file   Years of education: Not on file   Highest education level: Not on file  Occupational History   Not on file  Tobacco Use   Smoking status: Never Smoker   Smokeless tobacco: Never Used  Vaping Use   Vaping Use: Never used  Substance and Sexual Activity   Alcohol use: Yes    Alcohol/week: 14.0 standard drinks    Types: 14 Cans of beer per week    Comment: moderate   Drug use: No   Sexual activity: Not Currently  Other Topics Concern   Not on file  Social History Narrative   Not on file   Social Determinants of Health   Financial Resource Strain:    Difficulty of Paying Living Expenses:   Food Insecurity:    Worried About Charity fundraiser in the Last Year:    Arboriculturist in the Last Year:   Transportation Needs:    Film/video editor (Medical):    Lack of Transportation (Non-Medical):   Physical Activity:    Days of Exercise per Week:    Minutes of Exercise per Session:   Stress:    Feeling of Stress :   Social Connections:    Frequency of Communication with Friends and Family:    Frequency of Social Gatherings with Friends and Family:  Attends Religious Services:    Active Member of Clubs or Organizations:    Attends Music therapist:    Marital Status:   Intimate Partner Violence:    Fear of Current or Ex-Partner:    Emotionally Abused:    Physically Abused:    Sexually Abused:     Family History  Problem Relation Age of Onset   Cancer Mother    Hypertension Father     Medications Prior to Admission  Medication Sig Dispense Refill Last Dose   aspirin EC 81 MG tablet Take 81 mg by mouth daily. Swallow whole.   05/27/2020   cholecalciferol (VITAMIN D) 1000 UNITS tablet Take 1,000 Units by mouth daily.   Past Week at  Unknown time   Coenzyme Q10 (COQ10) 100 MG CAPS Take 100 ng by mouth daily.    Past Week at Unknown time   [employer] FIBER GUMMIES PO Take 1 tablet by mouth daily.   Past Week at Unknown time   docusate sodium (COLACE) 100 MG capsule Take 100 mg by mouth daily.   Past Week at Unknown time   ezetimibe (ZETIA) 10 MG tablet Take 10 mg by mouth daily.   05/30/2020 at 0730   Glucosamine-Chondroitin (COSAMIN DS PO) Take 1 tablet by mouth daily.   Past Week at Unknown time   losartan (COZAAR) 50 MG tablet Take 50 mg by mouth daily.    05/29/2020 at Unknown time   meloxicam (MOBIC) 15 MG tablet Take 15 mg by mouth daily as needed (knee pain/inflammation.).    Past Month at Unknown time   Multiple Vitamins-Minerals (ADULT GUMMY) CHEW Chew 1 tablet by mouth daily.   Past Week at Unknown time   omega-3 acid ethyl esters (LOVAZA) 1 G capsule Take 1 g by mouth daily.   Past Week at Unknown time   tamsulosin (FLOMAX) 0.4 MG CAPS capsule Take 0.4 mg by mouth daily.   05/30/2020 at 0730   TURMERIC PO Take 1 capsule by mouth daily.   Past Week at Unknown time   Lidocaine-Glycerin (PREPARATION H EX) Apply 1 application topically as needed (hemorrhoidal discomfort).   Unknown at Unknown time    Current Facility-Administered Medications  Medication Dose Route Frequency Provider Last Rate Last Admin   bupivacaine liposome (EXPAREL) 1.3 % injection 266 mg  20 mL Infiltration Once Michael Boston, MD       cefTRIAXone (ROCEPHIN) 2 g in sodium chloride 0.9 % 100 mL IVPB  2 g Intravenous On Call to OR Michael Boston, MD       And   metroNIDAZOLE (FLAGYL) IVPB 500 mg  500 mg Intravenous On Call to OR Michael Boston, MD       Chlorhexidine Gluconate Cloth 2 % PADS 6 each  6 each Topical Once Michael Boston, MD       And   Chlorhexidine Gluconate Cloth 2 % PADS 6 each  6 each Topical Once Michael Boston, MD       [START ON 05/31/2020] feeding supplement (ENSURE PRE-SURGERY) liquid 296 mL  296 mL Oral Once Michael Boston, MD        lactated ringers infusion   Intravenous Continuous Lillia Abed, MD 10 mL/hr at 05/30/20 0935 New Bag at 05/30/20 0935     Allergies  Allergen Reactions   Azithromycin Rash    BP (!) 173/93   Pulse 77   Temp 98.2 F (36.8 C) (Oral)   Resp 17   Ht 5\' 7"  (1.702 m)  Wt 83.1 kg   SpO2 100%   BMI 28.69 kg/m   Labs: No results found for this or any previous visit (from the past 48 hour(s)).  Imaging / Studies: No results found.   Adin Hector, M.D., F.A.C.S. Gastrointestinal and Minimally Invasive Surgery [facility] [address] [place], [place] [address] [phone] Main / Paging  05/30/2020 10:53 AM    Adin Hector

## 2020-05-30 NOTE — Transfer of Care (Signed)
Immediate Anesthesia Transfer of Care Note  Patient: Jonathan Peters  Procedure(s) Performed: Procedure(s): HEMORRHOIDECTOMY WITH LIGATION AND HEMORRHOIDOPEXY ANORECTAL EXAM UNDER ANESTHESIA (N/A)  Patient Location: PACU  Anesthesia Type:General  Level of Consciousness:  sedated, patient cooperative and responds to stimulation  Airway & Oxygen Therapy:Patient Spontanous Breathing and Patient connected to face mask oxgen  Post-op Assessment:  Report given to PACU RN and Post -op Vital signs reviewed and stable  Post vital signs:  Reviewed and stable  Last Vitals:  Vitals:   05/30/20 0928  BP: (!) 173/93  Pulse: 77  Resp: 17  Temp: 36.8 C  SpO2: 128%    Complications: No apparent anesthesia complications

## 2020-05-30 NOTE — Discharge Instructions (Signed)
ANORECTAL SURGERY:  POST OPERATIVE INSTRUCTIONS  ######################################################################  EAT Start with a pureed / full liquid diet After 24 hours, gradually transition to a high fiber diet.    CONTROL PAIN Control pain so you can tolerate bowel movements,  walk, sleep, tolerate sneezing/coughing, and go up/down stairs.   HAVE A BOWEL MOVEMENT DAILY Keep your bowels regular to avoid problems.   Taking a fiber supplement every day to keep bowels soft.   Try a laxative to override constipation. Use an antidairrheal to slow down diarrhea.   Call if not better after 2 tries  WALK Walk an hour a day.  Control your pain to do that.   CALL IF YOU HAVE PROBLEMS/CONCERNS Call if you are still struggling despite following these instructions. Call if you have concerns not answered by these instructions  ######################################################################    1. Take your usually prescribed home medications unless otherwise directed.  2. DIET: Follow a light bland diet & liquids the first 24 hours after arrival home, such as soup, liquids, starches, etc.  Be sure to drink plenty of fluids.  Quickly advance to a usual solid diet within a few days.  Avoid fast food or heavy meals as your are more likely to get nauseated or have irregular bowels.  A low-fat, high-fiber diet for the rest of your life is ideal.  3. PAIN CONTROL: a. Pain is best controlled by a usual combination of three different methods TOGETHER: i. Ice/Heat ii. Over the counter pain medication iii. Prescription pain medication b. Expect swelling and discomfort in the anus/rectal area.  Warm water baths (30-60 minutes up to 6 times a day, especially after bowel meovements) will help. Use ice for the first few days to help decrease swelling and bruising, then switch to heat such as warm towels, sitz baths, warm baths, etc to help relax tight/sore spots and speed recovery.   Some people prefer to use ice alone, heat alone, alternating between ice & heat.  Experiment to what works for you.   c. It is helpful to take an over-the-counter pain medication continuously for the first few weeks.  Choose one of the following that works best for you: i. Naproxen (Aleve, etc)  Two 250m tabs twice a day ii. Ibuprofen (Advil, etc) Three 2072mtabs four times a day (every meal & bedtime) iii. Acetaminophen (Tylenol, etc) 500-65044mour times a day (every meal & bedtime) d. A  prescription for pain medication (such as oxycodone, hydrocodone, etc) should be given to you upon discharge.  Take your pain medication as prescribed.  i. If you are having problems/concerns with the prescription medicine (does not control pain, nausea, vomiting, rash, itching, etc), please call us [phone] see if we need to switch you to a different pain medicine that will work better for you and/or control your side effect better. ii. If you need a refill on your pain medication, please contact your pharmacy.  They will contact our office to request authorization. Prescriptions will not be filled after 5 pm or on week-ends.  If can take up to 48 hours for it to be filled & ready so avoid waiting until you are down to thel ast pill. e. A topical cream (Dibucaine) or a prescription for a cream (such as diltiazem 2% gel) may be given to you.  Many people find relief with topical creams.  Some people find it burns too much.  Experiment.  If it helps, use it.  If it burns, don't using  it.  Use a Sitz Bath 4-8 times a day for relief   [employer] A sitz bath is a warm water bath taken in the sitting position that covers only the hips and buttocks. It may be used for either healing or hygiene purposes. Sitz baths are also used to relieve pain, itching, or muscle spasms. The water may contain medicine. Moist heat will help you heal and relax.  HOME CARE INSTRUCTIONS  Take 3 to 4 sitz baths a day. 1. Fill the  bathtub half full with warm water. 2. Sit in the water and open the drain a little. 3. Turn on the warm water to keep the tub half full. Keep the water running constantly. 4. Soak in the water for 15 to 20 minutes. 5. After the sitz bath, pat the affected area dry first.   4. KEEP YOUR BOWELS REGULAR a. The goal is one soft bowel movement a day b. Avoid getting constipated.  Between the surgery and the pain medications, it is common to experience some constipation.  Increasing fluid intake and taking a fiber supplement (such as Metamucil, Citrucel, FiberCon, MiraLax, etc) 2-3 times a day regularly will usually help prevent this problem from occurring.  A mild laxative (prune juice, Milk of Magnesia, MiraLax, etc) should be taken according to package directions if there are no bowel movements after 48 hours. c. Watch out for diarrhea.  If you have many loose bowel movements, simplify your diet to bland foods & liquids for a few days.  Stop any stool softeners and decrease your fiber supplement.  Switching to mild anti-diarrheal medications (Kayopectate, Pepto Bismol) can help.  Can try an imodium/loperamide dose.  If this worsens or does not improve, please call us.  5. Wound Care  a. Remove your bandages with your first bowel movement, usually the day after surgery.  You may have packing if you had an abscess.  Let any packing or gauze fall come out.   b. Wear an absorbent pad or soft cotton balls in your underwear as needed to catch any drainage and help keep the area  c. Keep the area clean and dry.  Bathe / shower every day.  Keep the area clean by showering / bathing over the incision / wound.   It is okay to soak an open wound to help wash it.  Consider using a squeeze bottle filled with warm water to gently wash the anal area.  Wet wipes or showers / gentle washing after bowel movements is often less traumatic than regular toilet paper. d. Dennis Bast will often notice bleeding with bowel movements.   This should slow down by the end of the first week of surgery.  Sitting on an ice pack can help. e. Expect some drainage.  This should slow down by the end of the first week of surgery, but you will have occasional bleeding or drainage up to a few months after surgery.  Wear an absorbent pad or soft cotton gauze in your underwear until the drainage stops.  6. ACTIVITIES as tolerated:   a. You may resume regular (light) daily activities beginning the next day--such as daily self-care, walking, climbing stairs--gradually increasing activities as tolerated.  If you can walk 30 minutes without difficulty, it is safe to try more intense activity such as jogging, treadmill, bicycling, low-impact aerobics, swimming, etc. b. Save the most intensive and strenuous activity for last such as sit-ups, heavy lifting, contact sports, etc  Refrain from any heavy lifting or straining  until you are off narcotics for pain control.   c. DO NOT PUSH THROUGH PAIN.  Let pain be your guide: If it hurts to do something, don't do it.  Pain is your body warning you to avoid that activity for another week until the pain goes down. d. You may drive when you are no longer taking prescription pain medication, you can comfortably sit for long periods of time, and you can safely maneuver your car and apply brakes. e. Dennis Bast may have sexual intercourse when it is comfortable.  7. FOLLOW UP in our office a. Please call [facility] at [phone] to set up an appointment to see your surgeon in the office for a follow-up appointment approximately 2-3 weeks after your surgery. b. Make sure that you call for this appointment the day you arrive home to ensure a convenient appointment time.  8. IF YOU HAVE DISABILITY OR FAMILY LEAVE FORMS, BRING THEM TO THE OFFICE FOR PROCESSING.  DO NOT GIVE THEM TO YOUR DOCTOR.        WHEN TO CALL us [phone]: 1. Poor pain control 2. Reactions / problems with new medications (rash/itching, nausea,  etc)  3. Fever over 101.5 F (38.5 C) 4. Inability to urinate 5. Nausea and/or vomiting 6. Worsening swelling or bruising 7. Continued bleeding from incision. 8. Increased pain, redness, or drainage from the incision  The clinic staff is available to answer your questions during regular business hours (8:30am-5pm).  Please don't hesitate to call and ask to speak to one of our nurses for clinical concerns.   A surgeon from [facility] is always on call at the hospitals   If you have a medical emergency, go to the nearest emergency room or call 911.    [facility], [place], [place], [place], [place]  [id number] ? MAIN: [phone] ? TOLL FREE: [phone] ? FAX [phone] [email or web address]     General Anesthesia, Adult, Care After This sheet gives you information about how to care for yourself after your procedure. Your health care provider may also give you more specific instructions. If you have problems or questions, contact your health care provider. What can I expect after the procedure? After the procedure, the following side effects are common:  Pain or discomfort at the IV site.  Nausea.  Vomiting.  Sore throat.  Trouble concentrating.  Feeling cold or chills.  Weak or tired.  Sleepiness and fatigue.  Soreness and body aches. These side effects can affect parts of the body that were not involved in surgery. Follow these instructions at home:  For at least 24 hours after the procedure:  Have a responsible adult stay with you. It is important to have someone help care for you until you are awake and alert.  Rest as needed.  Do not: ? Participate in activities in which you could fall or become injured. ? Drive. ? Use heavy machinery. ? Drink alcohol. ? Take sleeping pills or medicines that cause drowsiness. ? Make important decisions or sign legal documents. ? Take care of children on your  own. Eating and drinking  Follow any instructions from your health care provider about eating or drinking restrictions.  When you feel hungry, start by eating small amounts of foods that are soft and easy to digest (bland), such as toast. Gradually return to your regular diet.  Drink enough fluid to keep your urine pale yellow.  If you vomit, rehydrate by drinking water, juice,  or clear broth. General instructions  If you have sleep apnea, surgery and certain medicines can increase your risk for breathing problems. Follow instructions from your health care provider about wearing your sleep device: ? Anytime you are sleeping, including during daytime naps. ? While taking prescription pain medicines, sleeping medicines, or medicines that make you drowsy.  Return to your normal activities as told by your health care provider. Ask your health care provider what activities are safe for you.  Take over-the-counter and prescription medicines only as told by your health care provider.  If you smoke, do not smoke without supervision.  Keep all follow-up visits as told by your health care provider. This is important. Contact a health care provider if:  You have nausea or vomiting that does not get better with medicine.  You cannot eat or drink without vomiting.  You have pain that does not get better with medicine.  You are unable to pass urine.  You develop a skin rash.  You have a fever.  You have redness around your IV site that gets worse. Get help right away if:  You have difficulty breathing.  You have chest pain.  You have blood in your urine or stool, or you vomit blood. Summary  After the procedure, it is common to have a sore throat or nausea. It is also common to feel tired.  Have a responsible adult stay with you for the first 24 hours after general anesthesia. It is important to have someone help care for you until you are awake and alert.  When you feel hungry,  start by eating small amounts of foods that are soft and easy to digest (bland), such as toast. Gradually return to your regular diet.  Drink enough fluid to keep your urine pale yellow.  Return to your normal activities as told by your health care provider. Ask your health care provider what activities are safe for you. This information is not intended to replace advice given to you by your health care provider. Make sure you discuss any questions you have with your health care provider. Document Revised: 10/07/2017 Document Reviewed: 05/20/2017 Elsevier Patient Education  [place].

## 2020-05-30 NOTE — Anesthesia Preprocedure Evaluation (Addendum)
Anesthesia Evaluation  Patient identified by MRN, date of birth, ID band Patient awake    Reviewed: Allergy & Precautions, NPO status , Patient's Chart, lab work & pertinent test results  Airway Mallampati: II  TM Distance: >3 FB Neck ROM: Full    Dental  (+) Teeth Intact, Dental Advisory Given, Chipped,    Pulmonary neg pulmonary ROS,    Pulmonary exam normal breath sounds clear to auscultation       Cardiovascular hypertension, Pt. on medications Normal cardiovascular exam Rhythm:Regular Rate:Normal     Neuro/Psych negative neurological ROS  negative psych ROS   GI/Hepatic Neg liver ROS, RECURRENT HEMORRHOIDS PROLAPSING GRADE 3 WITH BLEEDING AND PAIN, EXTERNAL HEMORRHOIDS   Endo/Other  negative endocrine ROS  Renal/GU negative Renal ROS     Musculoskeletal  (+) Arthritis ,   Abdominal   Peds  Hematology negative hematology ROS (+)   Anesthesia Other Findings   Reproductive/Obstetrics                            Anesthesia Physical Anesthesia Plan  ASA: II  Anesthesia Plan: General   Post-op Pain Management:    Induction: Intravenous  PONV Risk Score and Plan: 2 and Midazolam, Dexamethasone and Ondansetron  Airway Management Planned: Oral ETT  Additional Equipment:   Intra-op Plan:   Post-operative Plan: Extubation in OR  Informed Consent: I have reviewed the patients History and Physical, chart, labs and discussed the procedure including the risks, benefits and alternatives for the proposed anesthesia with the patient or authorized representative who has indicated his/her understanding and acceptance.       Plan Discussed with: CRNA  Anesthesia Plan Comments:        Anesthesia Quick Evaluation

## 2020-06-02 ENCOUNTER — Encounter (HOSPITAL_COMMUNITY): Payer: Self-pay | Admitting: Surgery

## 2020-06-02 LAB — SURGICAL PATHOLOGY

## 2020-09-05 ENCOUNTER — Ambulatory Visit: Payer: Federal, State, Local not specified - PPO | Admitting: Podiatry

## 2020-09-05 ENCOUNTER — Other Ambulatory Visit: Payer: Self-pay

## 2020-09-05 DIAGNOSIS — G5762 Lesion of plantar nerve, left lower limb: Secondary | ICD-10-CM

## 2020-09-16 NOTE — Progress Notes (Signed)
  Subjective:  Patient ID: TYHEIM VANALSTYNE, male    DOB: April 06, 1956,  MRN: [id number]  Chief Complaint  Patient presents with  . Neuroma    Pt states neuroma unresolved, still having discomfort although he has had some improvement with previous injections.   64 y.o. male presents with the above complaint. History confirmed with patient.    Objective:  Physical Exam: warm, good capillary refill, no trophic changes or ulcerative lesions, normal DP and PT pulses and normal sensory exam. Left Foot: POP left 3rd interspace with Mulder's click.    Assessment:   1. Morton neuroma, left    Plan:  Patient was evaluated and treated and all questions answered.  Morton Neuroma -Sclerosing therapy initiated  Procedure: Neurolysis Location: Left 3rd interspace Skin Prep: Alcohol. Injectate: 4% alcohol sclerosing injection. Disposition: Patient tolerated procedure well. Injection site dressed with a band-aid.   No follow-ups on file.

## 2020-09-19 ENCOUNTER — Other Ambulatory Visit: Payer: Self-pay

## 2020-09-19 ENCOUNTER — Ambulatory Visit: Payer: Federal, State, Local not specified - PPO | Admitting: Podiatry

## 2020-09-19 DIAGNOSIS — G5782 Other specified mononeuropathies of left lower limb: Secondary | ICD-10-CM

## 2020-09-19 DIAGNOSIS — G5762 Lesion of plantar nerve, left lower limb: Secondary | ICD-10-CM

## 2020-09-19 NOTE — Progress Notes (Signed)
  Subjective:  Patient ID: Jonathan Peters, male    DOB: 15-Jun-1956,  MRN: [id number]  Chief Complaint  Patient presents with  . Foot Pain    left foot pain. PT stated that he is doing okay, he has some discomfort but it is not painful    64 y.o. male presents with the above complaint. History confirmed with patient. States when he does not wear pad he feels like he has a pad at the area. Cannot tell if shot has helped.   Objective:  Physical Exam: warm, good capillary refill, no trophic changes or ulcerative lesions, normal DP and PT pulses and normal sensory exam. Left Foot: POP left 3rd interspace with Mulder's click.    Assessment:   No diagnosis found. Plan:  Patient was evaluated and treated and all questions answered.  Morton Neuroma -Sclerosing injection #2 today  Procedure: Neurolysis Location: Left 3rd interspace Skin Prep: Alcohol. Injectate: 4% alcohol sclerosing injection. Disposition: Patient tolerated procedure well. Injection site dressed with a band-aid.  No follow-ups on file.

## 2020-10-03 ENCOUNTER — Other Ambulatory Visit: Payer: Self-pay

## 2020-10-03 ENCOUNTER — Ambulatory Visit: Payer: Federal, State, Local not specified - PPO | Admitting: Podiatry

## 2020-10-03 DIAGNOSIS — M5090 Cervical disc disorder, unspecified, unspecified cervical region: Secondary | ICD-10-CM | POA: Insufficient documentation

## 2020-10-03 DIAGNOSIS — I10 Essential (primary) hypertension: Secondary | ICD-10-CM | POA: Insufficient documentation

## 2020-10-03 DIAGNOSIS — E663 Overweight: Secondary | ICD-10-CM | POA: Insufficient documentation

## 2020-10-03 DIAGNOSIS — Z860100 Personal history of colon polyps, unspecified: Secondary | ICD-10-CM | POA: Insufficient documentation

## 2020-10-03 DIAGNOSIS — G5762 Lesion of plantar nerve, left lower limb: Secondary | ICD-10-CM

## 2020-10-03 DIAGNOSIS — K5909 Other constipation: Secondary | ICD-10-CM | POA: Insufficient documentation

## 2020-10-03 DIAGNOSIS — E7800 Pure hypercholesterolemia, unspecified: Secondary | ICD-10-CM | POA: Insufficient documentation

## 2020-10-03 DIAGNOSIS — K649 Unspecified hemorrhoids: Secondary | ICD-10-CM | POA: Insufficient documentation

## 2020-10-03 DIAGNOSIS — M151 Heberden's nodes (with arthropathy): Secondary | ICD-10-CM | POA: Insufficient documentation

## 2020-10-03 DIAGNOSIS — G479 Sleep disorder, unspecified: Secondary | ICD-10-CM | POA: Insufficient documentation

## 2020-10-03 DIAGNOSIS — K625 Hemorrhage of anus and rectum: Secondary | ICD-10-CM | POA: Insufficient documentation

## 2020-10-03 DIAGNOSIS — K6289 Other specified diseases of anus and rectum: Secondary | ICD-10-CM | POA: Insufficient documentation

## 2020-10-03 DIAGNOSIS — E782 Mixed hyperlipidemia: Secondary | ICD-10-CM | POA: Insufficient documentation

## 2020-10-03 DIAGNOSIS — G5782 Other specified mononeuropathies of left lower limb: Secondary | ICD-10-CM

## 2020-10-03 DIAGNOSIS — K648 Other hemorrhoids: Secondary | ICD-10-CM | POA: Insufficient documentation

## 2020-10-03 NOTE — Progress Notes (Signed)
  Subjective:  Patient ID: Jonathan Peters, male    DOB: 17-Nov-1955,  MRN: [id number]  Chief Complaint  Patient presents with  . Neuroma    Pt states no improvement with last sclerosing injection, no changes.   64 y.o. male presents with the above complaint. History confirmed with patient. Not sure if the injection helped at all.  Objective:  Physical Exam: warm, good capillary refill, no trophic changes or ulcerative lesions, normal DP and PT pulses and normal sensory exam. Left Foot: POP left 3rd interspace with Mulder's click.    Assessment:   1. 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  Morton Neuroma -Sclerosing injection #3 today.  If pain does not improve we will consider ceasing injection therapy.  Procedure: Neurolysis Location: Left 3rd interspace Skin Prep: Alcohol. Injectate: 4% alcohol sclerosing injection. Disposition: Patient tolerated procedure well. Injection site dressed with a band-aid.   No follow-ups on file.

## 2020-10-21 ENCOUNTER — Ambulatory Visit: Payer: Federal, State, Local not specified - PPO | Admitting: Podiatry

## 2020-11-10 DIAGNOSIS — R152 Fecal urgency: Secondary | ICD-10-CM | POA: Diagnosis not present

## 2020-11-10 DIAGNOSIS — K580 Irritable bowel syndrome with diarrhea: Secondary | ICD-10-CM | POA: Diagnosis not present

## 2021-01-02 DIAGNOSIS — Z96652 Presence of left artificial knee joint: Secondary | ICD-10-CM | POA: Diagnosis not present

## 2021-01-02 DIAGNOSIS — M1711 Unilateral primary osteoarthritis, right knee: Secondary | ICD-10-CM | POA: Diagnosis not present

## 2021-01-09 ENCOUNTER — Ambulatory Visit
Admission: RE | Admit: 2021-01-09 | Discharge: 2021-01-09 | Disposition: A | Discharge status: Home / Self Care | Payer: Federal, State, Local not specified - PPO | Source: Ambulatory Visit | Attending: Internal Medicine | Admitting: Internal Medicine

## 2021-01-09 ENCOUNTER — Other Ambulatory Visit: Payer: Self-pay | Admitting: Internal Medicine

## 2021-01-09 DIAGNOSIS — K5900 Constipation, unspecified: Secondary | ICD-10-CM | POA: Diagnosis not present

## 2021-02-04 DIAGNOSIS — A6920 Lyme disease, unspecified: Secondary | ICD-10-CM | POA: Diagnosis not present

## 2021-02-05 ENCOUNTER — Ambulatory Visit: Payer: Federal, State, Local not specified - PPO | Attending: Surgery | Admitting: Physical Therapy

## 2021-02-05 ENCOUNTER — Other Ambulatory Visit: Payer: Self-pay

## 2021-02-05 DIAGNOSIS — M6281 Muscle weakness (generalized): Secondary | ICD-10-CM | POA: Diagnosis not present

## 2021-02-05 DIAGNOSIS — R152 Fecal urgency: Secondary | ICD-10-CM | POA: Insufficient documentation

## 2021-02-05 DIAGNOSIS — R278 Other lack of coordination: Secondary | ICD-10-CM | POA: Diagnosis not present

## 2021-02-05 NOTE — Therapy (Signed)
[facility] [address], [place], [place], [address] Phone: [phone]   Fax:  [phone]  Physical Therapy Evaluation  Patient Details  Name: Jonathan Peters MRN: [id number] Date of Birth: [birth year] Referring Provider (PT): Dr. Michael Boston   Encounter Date: 02/05/2021   PT End of Session - 02/05/21 1150    Visit Number 1    Date for PT Re-Evaluation 04/30/21    Authorization Type BCBS    PT Start Time 1145    PT Stop Time 1220    PT Time Calculation (min) 35 min    Activity Tolerance Patient tolerated treatment well    Behavior During Therapy [facility] for tasks assessed/performed           Past Medical History:  Diagnosis Date  . Arthritis    knees  . Hypercholesterolemia   . Hypertension   . Neuroma of foot     Past Surgical History:  Procedure Laterality Date  . COLONOSCOPY N/A 09/18/2013   Procedure: COLONOSCOPY;  Surgeon: Garlan Fair, MD;  Location: [facility];  Service: Endoscopy;  Laterality: N/A;  . EVALUATION UNDER ANESTHESIA WITH HEMORRHOIDECTOMY N/A 05/30/2020   Procedure: HEMORRHOIDECTOMY WITH LIGATION AND HEMORRHOIDOPEXY ANORECTAL EXAM UNDER ANESTHESIA;  Surgeon: Michael Boston, MD;  Location: [facility];  Service: General;  Laterality: N/A;  . FRACTURE SURGERY Left    ankle  . HEMORRHOID SURGERY    . LAPAROSCOPIC APPENDECTOMY N/A 06/30/2019   Procedure: APPENDECTOMY LAPAROSCOPIC;  Surgeon: Jules Husbands, MD;  Location: [facility] ORS;  Service: General;  Laterality: N/A;  . TONSILLECTOMY    . VASECTOMY      There were no vitals filed for this visit.    Subjective Assessment - 02/05/21 1152    Subjective Patient had a Hemorroidectomy on 05/30/2020. Patient has discomfort at all times. Feels like he does not have a complete bowel movement. Strain a little to have a bowel movement. type 2 for initial bowel movement and Tye 7 for the last 2 bowel movements. Typicall has 2-3 BM and most are in the  morning.    Patient Stated Goals reduce urge    Currently in Pain? Yes    Pain Score 3     Pain Location Rectum    Pain Orientation Mid    Pain Descriptors / Indicators Other (Comment)   urge to have a BM   Pain Type Acute pain    Pain Onset More than a month ago    Pain Frequency Constant    Aggravating Factors  sitting    Pain Relieving Factors walking    Multiple Pain Sites No              [facility] PT Assessment - 02/05/21 0001      Assessment   Medical Diagnosis R15.2 Rectal Urgency    Referring Provider (PT) Dr. Michael Boston    Onset Date/Surgical Date --   05/30/2020   Prior Therapy none      Precautions   Precautions None      Restrictions   Weight Bearing Restrictions No      Balance Screen   Has the patient fallen in the past 6 months No    Has the patient had a decrease in activity level because of a fear of falling?  No    Is the patient reluctant to leave their home because of a fear of falling?  No      Home Environment   Living  Environment Private residence      Prior Function   Level of Independence Independent    Vocation Retired    Leisure walk 5 time per week      Cognition   Overall Cognitive Status Within Functional Limits for tasks assessed      Posture/Postural Control   Posture/Postural Control Postural limitations    Postural Limitations Forward head;Rounded Shoulders    Posture Comments pelvis shifted to the left      ROM / Strength   AROM / PROM / Strength AROM;Strength;PROM      AROM   Lumbar Extension decreased by 50%      Strength   Overall Strength Comments unable to contract the lower abdominals and decreased coordination of not holding his breath                      Objective measurements completed on examination: See above findings.     Pelvic Floor Special Questions - 02/05/21 0001    Urinary Leakage No    Urinary frequency fecal urgency    Fecal incontinence Yes    Fluid intake water, beer, 1 per day  for energy drinks    Skin Integrity Intact    Pelvic Floor Internal Exam Patient confirms identification and approves PT to asses pelvic floor and treatment    Exam Type Rectal    Palpation tightness in the puborectalis, ilioccygeus, left side of the internal sphincter, tightness in the EAS; trouble pushing the therapist finger out due to tightening the EAS    Strength fair squeeze, definite lift                      PT Short Term Goals - 02/05/21 1624      PT SHORT TERM GOAL #1   Title independent with initial HEP    Time 4    Period Weeks    Status New    Target Date 03/05/21             PT Long Term Goals - 02/05/21 1713      PT LONG TERM GOAL #1   Title independent with advanced  HEP for core strength    Time 12    Period Weeks    Status New    Target Date 04/30/21      PT LONG TERM GOAL #2   Title able to bulge his pelvic floor with correct breath when he is toileting to push his stool out    Time 12    Period Weeks    Status New    Target Date 04/30/21      PT LONG TERM GOAL #3   Title able to have a complete bowel movement due to adding abdominal massage to his routine    Time 12    Period Weeks    Status New    Target Date 04/30/21      PT LONG TERM GOAL #4   Title rectal urgency has decreased to having it when he is to have a bowel movement and not all the time    Time 12    Period Weeks    Status New    Target Date 04/30/21                  Plan - 02/05/21 1705    Clinical Impression Statement Patientis a 65 year old male with rectal urgency since he had hemorrhoid surgery on 05/30/2020. Patient  does not feel he has had a complete bowel movement. She will have 2-3 bowel movements per day. The first bowel movement is Type 2 and the last ones are type 7. Patient is taking Linzess. Patient uregency is constant at a level 3/10. It is worse with sitting and better with walking. Patient posture consists of forward head, rounded shoulders  and hips shifted to the left. Patient is not able to contract his lower abdominals and difficulty to coordinate diaphragmatic breathing. Pelvic floor strength is 3/5 but he is not able to bulge the anal region. Patient has tightness in the puborectalis, left side of the intenral anal sphincter. When the patient tries to push the therapist finger out of the anal canal he will tighten the external anal sphincter. Patient reports he has had some fecal leakage. Patient will benefit from skilled therapy to improve pelvic floor coordination and correct breathing patttern to push the stool out.    Personal Factors and Comorbidities Comorbidity 1;Age    Comorbidities hemorrhoid surgery 05/30/2020    Examination-Activity Limitations Toileting    Examination-Participation Restrictions Community Activity    Stability/Clinical Decision Making Stable/Uncomplicated    Clinical Decision Making Low    Rehab Potential Excellent    PT Frequency 1x / week    PT Duration 12 weeks    PT Treatment/Interventions Biofeedback;ADLs/Self Care Home Management;Neuromuscular re-education;Therapeutic exercise;Therapeutic activities;Patient/family education;Manual techniques;Dry needling    PT Next Visit Plan internal work to the puborectalis, iliococcygeus; diaphragmatic breathing to bulge the pelvic floor, lower abdominal contraction    Consulted and Agree with Plan of Care Patient           Patient will benefit from skilled therapeutic intervention in order to improve the following deficits and impairments:  Decreased coordination,Increased fascial restricitons,Decreased activity tolerance,Increased muscle spasms,Pain,Decreased mobility,Decreased strength  Visit Diagnosis: Muscle weakness (generalized) - Plan: PT plan of care cert/re-cert  Other lack of coordination - Plan: PT plan of care cert/re-cert  Rectal urgency - Plan: PT plan of care cert/re-cert     Problem List Patient Active Problem List   Diagnosis  Date Noted  . Cervical disc disease 10/03/2020  . Chronic constipation 10/03/2020  . 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 Hypertension 10/03/2020  . Hemorrhoids without complication 88/82/8003  . Internal hemorrhoids 10/03/2020  . Mixed hyperlipidemia 10/03/2020  . Overweight 10/03/2020  . Personal history of colonic polyps 10/03/2020  . Pure hypercholesterolemia 10/03/2020  . Rectal bleeding 10/03/2020  . Rectal pain 10/03/2020  . Sleep disorder 10/03/2020  . Prolapsed internal hemorrhoids, grade 3, s/p ligation/pexy/hemorrhoidectomy 05/30/2020 05/30/2020  . History of total left knee replacement 12/17/2019  . Appendicitis 06/30/2019  . Osteoarthritis of knee 12/26/2017  . Tubular adenoma of colon 2014    Earlie Counts, PT 02/05/21 5:18 PM   [facility] [address] [place], [place], [address] Phone: [phone]   Fax:  [phone]  Name: Jonathan Peters MRN: [id number] Date of Birth: 08-Sep-1956

## 2021-02-13 ENCOUNTER — Ambulatory Visit: Payer: Federal, State, Local not specified - PPO | Admitting: Physical Therapy

## 2021-02-13 ENCOUNTER — Encounter: Payer: Self-pay | Admitting: Physical Therapy

## 2021-02-13 ENCOUNTER — Other Ambulatory Visit: Payer: Self-pay

## 2021-02-13 DIAGNOSIS — R278 Other lack of coordination: Secondary | ICD-10-CM

## 2021-02-13 DIAGNOSIS — R152 Fecal urgency: Secondary | ICD-10-CM

## 2021-02-13 DIAGNOSIS — M6281 Muscle weakness (generalized): Secondary | ICD-10-CM

## 2021-02-13 NOTE — Therapy (Signed)
[facility] [address], [place], [place], [address] Phone: [phone]   Fax:  [phone]  Physical Therapy Treatment  Patient Details  Name: Jonathan Peters MRN: [id number] Date of Birth: 1956/10/08 Referring Provider (PT): Dr. Michael Boston   Encounter Date: 02/13/2021   PT End of Session - 02/13/21 0807    Visit Number 2    Date for PT Re-Evaluation 04/30/21    Authorization Type BCBS    Authorization - Visit Number 2    Authorization - Number of Visits 20    PT Start Time 0800    PT Stop Time 0840    PT Time Calculation (min) 40 min    Activity Tolerance Patient tolerated treatment well    Behavior During Therapy [facility] for tasks assessed/performed           Past Medical History:  Diagnosis Date  . Arthritis    knees  . Hypercholesterolemia   . Hypertension   . Neuroma of foot     Past Surgical History:  Procedure Laterality Date  . COLONOSCOPY N/A 09/18/2013   Procedure: COLONOSCOPY;  Surgeon: Garlan Fair, MD;  [facility];  Service: Endoscopy;  Laterality: N/A;  . EVALUATION UNDER ANESTHESIA WITH HEMORRHOIDECTOMY N/A 05/30/2020   Procedure: HEMORRHOIDECTOMY WITH LIGATION AND HEMORRHOIDOPEXY ANORECTAL EXAM UNDER ANESTHESIA;  Surgeon: Michael Boston, MD;  Location: [facility];  Service: General;  Laterality: N/A;  . FRACTURE SURGERY Left    ankle  . HEMORRHOID SURGERY    . LAPAROSCOPIC APPENDECTOMY N/A 06/30/2019   Procedure: APPENDECTOMY LAPAROSCOPIC;  Surgeon: Jules Husbands, MD;  Location: [facility] ORS;  Service: General;  Laterality: N/A;  . TONSILLECTOMY    . VASECTOMY      There were no vitals filed for this visit.   Subjective Assessment - 02/13/21 0806    Subjective Right after the eval I had less pressure.    Patient Stated Goals reduce urge    Currently in Pain? Yes    Pain Score 6     Pain Location Rectum    Pain Orientation Mid    Pain Descriptors / Indicators Other  (Comment)   urge to have a BM   Pain Type Acute pain    Pain Onset More than a month ago    Pain Frequency Constant    Aggravating Factors  sitting    Pain Relieving Factors walking    Multiple Pain Sites No                          Pelvic Floor Special Questions - 02/13/21 0001    Pelvic Floor Internal Exam Patient confirms identification and approves PT to asses pelvic floor and treatment    Exam Type Rectal             [facility] Adult PT Treatment/Exercise - 02/13/21 0001      Self-Care   Self-Care Other Self-Care Comments    Other Self-Care Comments  using a tennis ball to massage the pelvic floor muscles in sitting to relax them      Lumbar Exercises: Stretches   Active Hamstring Stretch Right;Left;30 seconds;2 reps    Active Hamstring Stretch Limitations supine with hands and strap    ITB Stretch Right;Left;1 rep;30 seconds    ITB Stretch Limitations supine with strap    Other Lumbar Stretch Exercise happy baby one side with a strap and breathing into abdomen to relax the pelvic  floor      Manual Therapy   Manual Therapy Myofascial release;Internal Pelvic Floor    Manual therapy comments instructed patient on how to massage pelvic floor in sidely    Myofascial Release using the prickly ball roller to the gluteals, hamstring, quadricep, and hip adductors to release the  muscles    Internal Pelvic Floor manual work along the external anal sphincter, perineal body, puborectalis, anal sphincter                  PT Education - 02/13/21 0845    Education Details Access Code: [email or web address]    Methods Explanation;Demonstration;Verbal cues;Handout    Comprehension Returned demonstration;Verbalized understanding            PT Short Term Goals - 02/05/21 1624      PT SHORT TERM GOAL #1   Title independent with initial HEP    Time 4    Period Weeks    Status New    Target Date 03/05/21             PT Long Term Goals - 02/05/21 1713      PT LONG  TERM GOAL #1   Title independent with advanced  HEP for core strength    Time 12    Period Weeks    Status New    Target Date 04/30/21      PT LONG TERM GOAL #2   Title able to bulge his pelvic floor with correct breath when he is toileting to push his stool out    Time 12    Period Weeks    Status New    Target Date 04/30/21      PT LONG TERM GOAL #3   Title able to have a complete bowel movement due to adding abdominal massage to his routine    Time 12    Period Weeks    Status New    Target Date 04/30/21      PT LONG TERM GOAL #4   Title rectal urgency has decreased to having it when he is to have a bowel movement and not all the time    Time 12    Period Weeks    Status New    Target Date 04/30/21                 Plan - 02/13/21 0845    Clinical Impression Statement Patient felt better after the initial evaluation for a short period of time. He responded well to the treatment today and had no pain after the treatment. Patient understands hwo to stretch, massage, and relax the pelvic floor muscles for home exercise program. Patient will benefit from skilled therapy to improve pelvic floor coordination and correct breathing pattern to push the stool out.    Personal Factors and Comorbidities Comorbidity 1;Age    Comorbidities hemorrhoid surgery 05/30/2020    Examination-Activity Limitations Toileting    Examination-Participation Restrictions Community Activity    Stability/Clinical Decision Making Stable/Uncomplicated    Rehab Potential Excellent    PT Frequency 1x / week    PT Duration 12 weeks    PT Treatment/Interventions Biofeedback;ADLs/Self Care Home Management;Neuromuscular re-education;Therapeutic exercise;Therapeutic activities;Patient/family education;Manual techniques;Dry needling    PT Next Visit Plan internal work to the puborectalis, iliococcygeus; diaphragmatic breathing to bulge the pelvic floor, lower abdominal contraction    PT Home Exercise Plan  Access Code: [email or web address]    Recommended Other Services MD signed initial eval    Consulted and Agree  with Plan of Care Patient           Patient will benefit from skilled therapeutic intervention in order to improve the following deficits and impairments:  Decreased coordination,Increased fascial restricitons,Decreased activity tolerance,Increased muscle spasms,Pain,Decreased mobility,Decreased strength  Visit Diagnosis: Muscle weakness (generalized)  Other lack of coordination  Rectal urgency     Problem List Patient Active Problem List   Diagnosis Date Noted  . Cervical disc disease 10/03/2020  . Chronic constipation 10/03/2020  . 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 Hypertension 10/03/2020  . Hemorrhoids without complication 16/07/9603  . Internal hemorrhoids 10/03/2020  . Mixed hyperlipidemia 10/03/2020  . Overweight 10/03/2020  . Personal history of colonic polyps 10/03/2020  . Pure hypercholesterolemia 10/03/2020  . Rectal bleeding 10/03/2020  . Rectal pain 10/03/2020  . Sleep disorder 10/03/2020  . Prolapsed internal hemorrhoids, grade 3, s/p ligation/pexy/hemorrhoidectomy 05/30/2020 05/30/2020  . History of total left knee replacement 12/17/2019  . Appendicitis 06/30/2019  . Osteoarthritis of knee 12/26/2017  . Tubular adenoma of colon 2014    Earlie Counts, PT 02/13/21 11:51 AM   [facility] [address], [place], [place], [address] Phone: [phone]   Fax:  [phone]  Name: Jonathan Peters MRN: [id number] Date of Birth: 1956/03/04

## 2021-02-13 NOTE — Patient Instructions (Signed)
Access Code: [email or web address] URL: [email or web address] Date: 02/13/2021 Prepared by: Earlie Counts  Program Notes lay on your side and massage around the anus, between the gluteal folds.  Sit on a ball and massage the muscles between the sitting bone and coccyx bone use your cervical massager on the leg muscles.    Exercises Supine Hamstring Stretch - 1 x daily - 7 x weekly - 1 sets - 2 reps - 30 sec hold Supine Hamstring Stretch with Strap - 1 x daily - 7 x weekly - 1 sets - 2 reps - 30 sec hold Supine ITB Stretch with Strap - 1 x daily - 7 x weekly - 1 sets - 2 reps - 30 hold Supine Pelvic Floor Stretch - 1 x daily - 7 x weekly - 1 sets - 2 reps Diaphragmatic Breathing at 90/90 Supported - 3 x daily - 7 x weekly - 1 sets - 10 reps Diaphragmatic Breathing in 90/90 - 1 x daily - 7 x weekly - 3 sets - 10 reps [facility] [address], [place],  [address] Phone # [phone] Fax [phone]

## 2021-02-16 ENCOUNTER — Ambulatory Visit: Payer: Federal, State, Local not specified - PPO | Admitting: Physical Therapy

## 2021-02-16 DIAGNOSIS — E7800 Pure hypercholesterolemia, unspecified: Secondary | ICD-10-CM | POA: Diagnosis not present

## 2021-02-16 DIAGNOSIS — Z125 Encounter for screening for malignant neoplasm of prostate: Secondary | ICD-10-CM | POA: Diagnosis not present

## 2021-02-16 DIAGNOSIS — K649 Unspecified hemorrhoids: Secondary | ICD-10-CM | POA: Diagnosis not present

## 2021-02-16 DIAGNOSIS — Z0000 Encounter for general adult medical examination without abnormal findings: Secondary | ICD-10-CM | POA: Diagnosis not present

## 2021-02-16 DIAGNOSIS — K5900 Constipation, unspecified: Secondary | ICD-10-CM | POA: Diagnosis not present

## 2021-02-19 ENCOUNTER — Ambulatory Visit: Payer: Federal, State, Local not specified - PPO | Admitting: Physical Therapy

## 2021-03-02 ENCOUNTER — Ambulatory Visit: Payer: Federal, State, Local not specified - PPO | Attending: Surgery | Admitting: Physical Therapy

## 2021-03-02 ENCOUNTER — Encounter: Payer: Self-pay | Admitting: Physical Therapy

## 2021-03-02 ENCOUNTER — Other Ambulatory Visit: Payer: Self-pay

## 2021-03-02 DIAGNOSIS — R278 Other lack of coordination: Secondary | ICD-10-CM | POA: Insufficient documentation

## 2021-03-02 DIAGNOSIS — M6281 Muscle weakness (generalized): Secondary | ICD-10-CM | POA: Insufficient documentation

## 2021-03-02 DIAGNOSIS — R152 Fecal urgency: Secondary | ICD-10-CM | POA: Insufficient documentation

## 2021-03-02 NOTE — Therapy (Signed)
[facility] [address] [place], [place], [address] Phone: [phone]   Fax:  [phone]  Physical Therapy Treatment  Patient Details  Name: Jonathan Peters MRN: [id number] Date of Birth: Feb 28, 1956 Referring Provider (PT): Dr. Michael Boston   Encounter Date: 03/02/2021   PT End of Session - 03/02/21 1145    Visit Number 3    Date for PT Re-Evaluation 04/30/21    Authorization Type BCBS    Authorization - Visit Number 3    Authorization - Number of Visits 20    PT Start Time 1100    PT Stop Time 1142    PT Time Calculation (min) 42 min    Activity Tolerance Patient tolerated treatment well    Behavior During Therapy [facility] for tasks assessed/performed           Past Medical History:  Diagnosis Date  . Arthritis    knees  . Hypercholesterolemia   . Hypertension   . Neuroma of foot     Past Surgical History:  Procedure Laterality Date  . COLONOSCOPY N/A 09/18/2013   Procedure: COLONOSCOPY;  Surgeon: Garlan Fair, MD;  [facility]: [facility];  Service: Endoscopy;  Laterality: N/A;  . EVALUATION UNDER ANESTHESIA WITH HEMORRHOIDECTOMY N/A 05/30/2020   Procedure: HEMORRHOIDECTOMY WITH LIGATION AND HEMORRHOIDOPEXY ANORECTAL EXAM UNDER ANESTHESIA;  Surgeon: Michael Boston, MD;  Location: [facility];  Service: General;  Laterality: N/A;  . FRACTURE SURGERY Left    ankle  . HEMORRHOID SURGERY    . LAPAROSCOPIC APPENDECTOMY N/A 06/30/2019   Procedure: APPENDECTOMY LAPAROSCOPIC;  Surgeon: Jules Husbands, MD;  Location: [facility] OR[facility];  Service: General;  Laterality: N/A;  . TONSILLECTOMY    . VASECTOMY      There were no vitals filed for this visit.   Subjective Assessment - 03/02/21 1100    Subjective I am doing a little better. I had a physical and gave me a muscle relaxer and no difference.    Patient Stated Goals reduce urge    Currently in Pain? Yes    Pain Score 5     Pain Location Rectum    Pain Orientation  Mid    Pain Descriptors / Indicators Other (Comment)   urge to have a bowel movement   Pain Type Acute pain    Pain Onset More than a month ago    Pain Frequency Constant    Aggravating Factors  sitting    Pain Relieving Factors walking    Multiple Pain Sites No                          Pelvic Floor Special Questions - 03/02/21 0001    Pelvic Floor Internal Exam Patient confirms identification and approves PT to asses pelvic floor and treatment    Exam Type Rectal    Strength fair squeeze, definite lift             [facility] Adult PT Treatment/Exercise - 03/02/21 0001      Self-Care   Self-Care Other Self-Care Comments    Other Self-Care Comments  educated patient on pelvic floor meditation and some youtube suggestions; reviewed with patient on how to massage the anus prior to bowel movement      Therapeutic Activites    Therapeutic Activities Other Therapeutic Activities    Other Therapeutic Activities started on education on correct toileting with knees above hips, feet on stool and breath  Neuro Re-ed    Neuro Re-ed Details  breathing out to open up the anus and not contracting      Lumbar Exercises: Stretches   Other Lumbar Stretch Exercise happy baby with 2 legs      Manual Therapy   Manual Therapy Internal Pelvic Floor    Internal Pelvic Floor manual work to the external anal sphincter, along the puborectlis, along the iliococcygeus, on the pubic bone                  PT Education - 03/02/21 1145    Education Details pelvic floor meditation; toileting technique    Person(s) Educated Patient    Methods Explanation;Handout    Comprehension Verbalized understanding            PT Short Term Goals - 03/02/21 1148      PT SHORT TERM GOAL #1   Title independent with initial HEP    Time 4    Period Weeks    Status Achieved             PT Long Term Goals - 02/05/21 1713      PT LONG TERM GOAL #1   Title independent with advanced   HEP for core strength    Time 12    Period Weeks    Status New    Target Date 04/30/21      PT LONG TERM GOAL #2   Title able to bulge his pelvic floor with correct breath when he is toileting to push his stool out    Time 12    Period Weeks    Status New    Target Date 04/30/21      PT LONG TERM GOAL #3   Title able to have a complete bowel movement due to adding abdominal massage to his routine    Time 12    Period Weeks    Status New    Target Date 04/30/21      PT LONG TERM GOAL #4   Title rectal urgency has decreased to having it when he is to have a bowel movement and not all the time    Time 12    Period Weeks    Status New    Target Date 04/30/21                 Plan - 03/02/21 1104    Clinical Impression Statement Patient still has some difficulty with breathing out without contracting his pelvic floor. He was instructed some on toileting but needs further education. Patient felt better after manual work. Patient reports his muscle relaxers did not help. He went 3 times for a bowel movement today. Patient will benefit from skilled therapy to improve pelvic floor coordinaiton for improved bowel movements.    Personal Factors and Comorbidities Comorbidity 1;Age    Comorbidities hemorrhoid surgery 05/30/2020    Examination-Activity Limitations Toileting    Examination-Participation Restrictions Community Activity    Stability/Clinical Decision Making Stable/Uncomplicated    Rehab Potential Excellent    PT Frequency 1x / week    PT Duration 12 weeks    PT Treatment/Interventions Biofeedback;ADLs/Self Care Home Management;Neuromuscular re-education;Therapeutic exercise;Therapeutic activities;Patient/family education;Manual techniques;Dry needling    PT Next Visit Plan internal work to the puborectalis, iliococcygeus; diaphragmatic breathing to bulge the pelvic floor,toileting    PT Home Exercise Plan Access Code: [email or web address]    Consulted and Agree with Plan of Care  Patient  Patient will benefit from skilled therapeutic intervention in order to improve the following deficits and impairments:  Decreased coordination,Increased fascial restricitons,Decreased activity tolerance,Increased muscle spasms,Pain,Decreased mobility,Decreased strength  Visit Diagnosis: Muscle weakness (generalized)  Other lack of coordination  Rectal urgency     Problem List Patient Active Problem List   Diagnosis Date Noted  . Cervical disc disease 10/03/2020  . Chronic constipation 10/03/2020  . 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 Hypertension 10/03/2020  . Hemorrhoids without complication 41/93/7902  . Internal hemorrhoids 10/03/2020  . Mixed hyperlipidemia 10/03/2020  . Overweight 10/03/2020  . Personal history of colonic polyps 10/03/2020  . Pure hypercholesterolemia 10/03/2020  . Rectal bleeding 10/03/2020  . Rectal pain 10/03/2020  . Sleep disorder 10/03/2020  . Prolapsed internal hemorrhoids, grade 3, s/p ligation/pexy/hemorrhoidectomy 05/30/2020 05/30/2020  . History of total left knee replacement 12/17/2019  . Appendicitis 06/30/2019  . Osteoarthritis of knee 12/26/2017  . Tubular adenoma of colon 2014    Earlie Counts, PT 03/02/21 11:49 AM   [facility] [address], [place], [place], [address] Phone: [phone]   Fax:  [phone]  Name: Jonathan Peters MRN: [id number] Date of Birth: [birth year]

## 2021-03-02 NOTE — Patient Instructions (Addendum)
   10-Minute Breath Meditation for Pelvic Health and Healing    Pelvic Floor Relaxation  Root Chakra Healing  Pelvic Floor Meditation    A Guided Meditation for Pelvic Pain Relief  [facility] [address] [place], [place] [address] Phone # [phone] Fax [phone]

## 2021-03-11 ENCOUNTER — Ambulatory Visit: Payer: Federal, State, Local not specified - PPO | Admitting: Physical Therapy

## 2021-03-11 ENCOUNTER — Other Ambulatory Visit: Payer: Self-pay

## 2021-03-11 ENCOUNTER — Encounter: Payer: Self-pay | Admitting: Physical Therapy

## 2021-03-11 DIAGNOSIS — R152 Fecal urgency: Secondary | ICD-10-CM | POA: Diagnosis not present

## 2021-03-11 DIAGNOSIS — M6281 Muscle weakness (generalized): Secondary | ICD-10-CM

## 2021-03-11 DIAGNOSIS — R278 Other lack of coordination: Secondary | ICD-10-CM

## 2021-03-11 NOTE — Therapy (Signed)
[facility] [address] [place], [place], [address] Phone: [phone]   Fax:  [phone]  Physical Therapy Treatment  Patient Details  Name: Jonathan Peters MRN: [id number] Date of Birth: 1956/03/28 Referring Provider (PT): Dr. Michael Boston   Encounter Date: 03/11/2021   PT End of Session - 03/11/21 1156    Visit Number 4    Date for PT Re-Evaluation 04/30/21    Authorization Type BCBS    Authorization - Visit Number 4    Authorization - Number of Visits 20    PT Start Time 6606    PT Stop Time 1230    PT Time Calculation (min) 45 min    Activity Tolerance Patient tolerated treatment well    Behavior During Therapy [facility] for tasks assessed/performed           Past Medical History:  Diagnosis Date  . Arthritis    knees  . Hypercholesterolemia   . Hypertension   . Neuroma of foot     Past Surgical History:  Procedure Laterality Date  . COLONOSCOPY N/A 09/18/2013   Procedure: COLONOSCOPY;  Surgeon: Garlan Fair, MD;  Location: [facility];  Service: Endoscopy;  Laterality: N/A;  . EVALUATION UNDER ANESTHESIA WITH HEMORRHOIDECTOMY N/A 05/30/2020   Procedure: HEMORRHOIDECTOMY WITH LIGATION AND HEMORRHOIDOPEXY ANORECTAL EXAM UNDER ANESTHESIA;  Surgeon: Michael Boston, MD;  Location: [facility];  Service: General;  Laterality: N/A;  . FRACTURE SURGERY Left    ankle  . HEMORRHOID SURGERY    . LAPAROSCOPIC APPENDECTOMY N/A 06/30/2019   Procedure: APPENDECTOMY LAPAROSCOPIC;  Surgeon: Jules Husbands, MD;  Location: [facility];  Service: General;  Laterality: N/A;  . TONSILLECTOMY    . VASECTOMY      There were no vitals filed for this visit.   Subjective Assessment - 03/11/21 1152    Subjective I have been doing the exercises. I have been trying some of the medication. Does not feel he is having a complete bowel movement.    Patient Stated Goals reduce urge    Currently in Pain? Yes    Pain Score 5     Pain  Location Rectum    Pain Orientation Mid    Pain Descriptors / Indicators Other (Comment)   urge to have a bowel movement   Pain Type Acute pain    Pain Onset More than a month ago    Pain Frequency Constant    Aggravating Factors  sitting    Pain Relieving Factors walking                          Pelvic Floor Special Questions - 03/11/21 0001    Pelvic Floor Internal Exam Patient confirms identification and approves PT to asses pelvic floor and treatment    Exam Type Rectal             [facility] Adult PT Treatment/Exercise - 03/11/21 0001      Self-Care   Self-Care Other Self-Care Comments    Other Self-Care Comments  eduated patient on rectal wand to perform manual work to the rectal area and educated patient on performing abdominal massage for constipation      Manual Therapy   Manual Therapy Internal Pelvic Floor    Internal Pelvic Floor manual work to the external anal sphincter, along the puborectlis, along the iliococcygeus, on the pubic bone; distraction of the coccyx  PT Education - 03/11/21 1210    Education Details education on the pelvic wand to use on the rectal area    Person(s) Educated Patient    Methods Explanation;Demonstration;Handout    Comprehension Verbalized understanding            PT Short Term Goals - 03/02/21 1148      PT SHORT TERM GOAL #1   Title independent with initial HEP    Time 4    Period Weeks    Status Achieved             PT Long Term Goals - 03/11/21 1233      PT LONG TERM GOAL #1   Title independent with advanced  HEP for core strength    Time 12    Period Weeks    Status On-going      PT LONG TERM GOAL #2   Title able to bulge his pelvic floor with correct breath when he is toileting to push his stool out    Time 12    Period Weeks    Status On-going      PT LONG TERM GOAL #3   Title able to have a complete bowel movement due to adding abdominal massage to his routine     Time 12    Period Weeks    Status On-going      PT LONG TERM GOAL #4   Title rectal urgency has decreased to having it when he is to have a bowel movement and not all the time    Time 12    Period Weeks    Status On-going                 Plan - 03/11/21 1229    Clinical Impression Statement Patient was educated on rectal wand to massage the pelvic floor muscles at home and where to purchase it. Patient pain level has decreased from 6.5/10 to 5/10. Patient pelvic floor muscles were able to relax with greater ease with manual work. Therapist was not having as much difficutly placing her index finger into the anal canal. Patient has some decreased movement of the coccyx. Patient had 4 bowel movements this morning and are little nuggets. Therapist educated patient on how to perform abdominal massage to reduce constipation. Patient will benefit from skilled therapy to improve pelvic floor coordination for improve bowel movements.    Personal Factors and Comorbidities Comorbidity 1;Age    Comorbidities hemorrhoid surgery 05/30/2020    Examination-Activity Limitations Toileting    Examination-Participation Restrictions Community Activity    Stability/Clinical Decision Making Stable/Uncomplicated    Rehab Potential Excellent    PT Frequency 1x / week    PT Duration 12 weeks    PT Treatment/Interventions Biofeedback;ADLs/Self Care Home Management;Neuromuscular re-education;Therapeutic exercise;Therapeutic activities;Patient/family education;Manual techniques;Dry needling    PT Next Visit Plan internal work to the puborectalis, iliococcygeus; diaphragmatic breathing to bulge the pelvic floor,toileting; abdominal massage and see about the rectal wand    PT Home Exercise Plan Access Code: [email or web address]    Consulted and Agree with Plan of Care Patient           Patient will benefit from skilled therapeutic intervention in order to improve the following deficits and impairments:  Decreased  coordination,Increased fascial restricitons,Decreased activity tolerance,Increased muscle spasms,Pain,Decreased mobility,Decreased strength  Visit Diagnosis: Muscle weakness (generalized)  Other lack of coordination  Rectal urgency     Problem List Patient Active Problem List   Diagnosis Date Noted  .  Cervical disc disease 10/03/2020  . Chronic constipation 10/03/2020  . 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 Hypertension 10/03/2020  . Hemorrhoids without complication 51/07/2110  . Internal hemorrhoids 10/03/2020  . Mixed hyperlipidemia 10/03/2020  . Overweight 10/03/2020  . Personal history of colonic polyps 10/03/2020  . Pure hypercholesterolemia 10/03/2020  . Rectal bleeding 10/03/2020  . Rectal pain 10/03/2020  . Sleep disorder 10/03/2020  . Prolapsed internal hemorrhoids, grade 3, s/p ligation/pexy/hemorrhoidectomy 05/30/2020 05/30/2020  . History of total left knee replacement 12/17/2019  . Appendicitis 06/30/2019  . Osteoarthritis of knee 12/26/2017  . Tubular adenoma of colon 2014    Earlie Counts, PT 03/11/21 12:34 PM   [facility] [address] [place], [place], [address] Phone: [phone]   Fax:  [phone]  Name: Jonathan Peters MRN: [id number] Date of Birth: 06-19-1956

## 2021-03-11 NOTE — Patient Instructions (Addendum)
PELVIC FLOOR MASSAGE TOOL-SERENITY TMT-RECTAL [email or web address]  Vibrating pelvic wand by intimate rose Use the code to order online and get lubricant.     Intimate Rose Wand Rectal Use for Pain    Vibrating Pelvic Wand vs Original Pelvic Wand  [facility] [address] [place], [place] [address] Phone # [phone] Fax [phone]    Abdominal Massage for Constipation #[email or web address]

## 2021-03-26 ENCOUNTER — Ambulatory Visit: Payer: Federal, State, Local not specified - PPO | Attending: Surgery | Admitting: Physical Therapy

## 2021-03-26 ENCOUNTER — Encounter: Payer: Self-pay | Admitting: Physical Therapy

## 2021-03-26 ENCOUNTER — Other Ambulatory Visit: Payer: Self-pay

## 2021-03-26 DIAGNOSIS — M6281 Muscle weakness (generalized): Secondary | ICD-10-CM | POA: Diagnosis not present

## 2021-03-26 DIAGNOSIS — R152 Fecal urgency: Secondary | ICD-10-CM | POA: Insufficient documentation

## 2021-03-26 DIAGNOSIS — R278 Other lack of coordination: Secondary | ICD-10-CM | POA: Diagnosis not present

## 2021-03-26 NOTE — Therapy (Signed)
[facility] [address] [place], [place], [address] Phone: [phone]   Fax:  [phone]  Physical Therapy Treatment  Patient Details  Name: Jonathan Peters MRN: [id number] Date of Birth: 04-27-1956 Referring Provider (PT): Dr. Michael Boston   Encounter Date: 03/26/2021   PT End of Session - 03/26/21 1526     Visit Number 5    Date for PT Re-Evaluation 04/30/21    Authorization Type BCBS    Authorization - Visit Number 5    Authorization - Number of Visits 20    PT Start Time 5643    PT Stop Time 3295    PT Time Calculation (min) 42 min    Activity Tolerance Patient tolerated treatment well    Behavior During Therapy [facility] for tasks assessed/performed             Past Medical History:  Diagnosis Date   Arthritis    knees   Hypercholesterolemia    Hypertension    Neuroma of foot     Past Surgical History:  Procedure Laterality Date   COLONOSCOPY N/A 09/18/2013   Procedure: COLONOSCOPY;  Surgeon: Garlan Fair, MD;  [facility]: [facility];  Service: Endoscopy;  Laterality: N/A;   EVALUATION UNDER ANESTHESIA WITH HEMORRHOIDECTOMY N/A 05/30/2020   Procedure: HEMORRHOIDECTOMY WITH LIGATION AND HEMORRHOIDOPEXY ANORECTAL EXAM UNDER ANESTHESIA;  Surgeon: Michael Boston, MD;  [facility];  Service: General;  Laterality: N/A;   FRACTURE SURGERY Left    ankle   HEMORRHOID SURGERY     LAPAROSCOPIC APPENDECTOMY N/A 06/30/2019   Procedure: APPENDECTOMY LAPAROSCOPIC;  Surgeon: Jules Husbands, MD;  Location: [facility];  Service: General;  Laterality: N/A;   TONSILLECTOMY     VASECTOMY      There were no vitals filed for this visit.   Subjective Assessment - 03/26/21 1447     Subjective I go the wand and used it several times. I do not know if I am using it correctly. I do not know of the pain is getting any better. When I have a bowel movement and do not fell like I am getting a complete bowel movement.     Patient Stated Goals reduce urge    Currently in Pain? Yes    Pain Score 6     Pain Location Rectum    Pain Orientation Mid    Pain Descriptors / Indicators Discomfort    Pain Type Acute pain    Pain Onset More than a month ago    Pain Frequency Constant    Aggravating Factors  sitting,    Pain Relieving Factors walking    Multiple Pain Sites No                            Pelvic Floor Special Questions - 03/26/21 0001     Pelvic Floor Internal Exam Patient confirms identification and approves PT to asses pelvic floor and treatment    Exam Type Rectal               [facility] Adult PT Treatment/Exercise - 03/26/21 0001       Self-Care   Self-Care Other Self-Care Comments    Other Self-Care Comments  pelvic floor contraction to reduce th urge to void; educated patient on how to use the pelvic floor wand to release the pelvic lfoor muscles and he was able to demonstrate correctly      Manual Therapy  Manual Therapy Internal Pelvic Floor    Internal Pelvic Floor manual work to the coccygeus, puborectalis, iliococcygeus, release around the coccyx with ont nad on the outside, fascial release of the pelvic floor muscles with finding the path of least resistance                    PT Education - 03/26/21 1526     Education Details educated patient on how to work the pelvic wand and release the tissue and her returned demonstration    Person(s) Educated Patient    Methods Explanation;Demonstration;Handout    Comprehension Verbalized understanding;Returned demonstration              PT Short Term Goals - 03/02/21 1148       PT SHORT TERM GOAL #1   Title independent with initial HEP    Time 4    Period Weeks    Status Achieved               PT Long Term Goals - 03/26/21 1532       PT LONG TERM GOAL #1   Title independent with advanced  HEP for core strength    Time 12    Period Weeks    Status On-going      PT LONG TERM GOAL #2    Title able to bulge his pelvic floor with correct breath when he is toileting to push his stool out    Time 12    Period Weeks    Status On-going      PT LONG TERM GOAL #3   Title able to have a complete bowel movement due to adding abdominal massage to his routine    Time 12    Period Weeks    Status On-going      PT LONG TERM GOAL #4   Title rectal urgency has decreased to having it when he is to have a bowel movement and not all the time    Time 12    Period Weeks    Status On-going                   Plan - 03/26/21 1527     Clinical Impression Statement Patient is able to demonstrate the use of the pelvic wand on himself correctly. Patient is still having his pain but therapy is giving him some relief Patient is getting burning around the anus after he goes to the bathroom several times. Therapist gave him [facility] reveleum to ease the burning. Patient had tightness around the coccyx muscles. Therapist was able to gget her finger into the anal canal with greater ease. Patient had no pain after manual work. Patient will benefit from skilled therapy to improve pelvic floor coordination for improve bowel movements.    Personal Factors and Comorbidities Comorbidity 1;Age    Comorbidities hemorrhoid surgery 05/30/2020    Examination-Activity Limitations Toileting    Examination-Participation Restrictions Community Activity    Stability/Clinical Decision Making Stable/Uncomplicated    Rehab Potential Excellent    PT Frequency 1x / week    PT Duration 12 weeks    PT Treatment/Interventions Biofeedback;ADLs/Self Care Home Management;Neuromuscular re-education;Therapeutic exercise;Therapeutic activities;Patient/family education;Manual techniques;Dry needling    PT Next Visit Plan see how the wand doing, coccyx release, manual work internally, relesae of the puborectalis externally    PT Home Exercise Plan Access Code: [email or web address]    Consulted and Agree with Plan of Care  Patient  Patient will benefit from skilled therapeutic intervention in order to improve the following deficits and impairments:  Decreased coordination, Increased fascial restricitons, Decreased activity tolerance, Increased muscle spasms, Pain, Decreased mobility, Decreased strength  Visit Diagnosis: Muscle weakness (generalized)  Other lack of coordination  Rectal urgency     Problem List Patient Active Problem List   Diagnosis Date Noted   Cervical disc disease 10/03/2020   Chronic constipation 10/03/2020   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   Hypertension 10/03/2020   Hemorrhoids without complication 22/84/0698   Internal hemorrhoids 10/03/2020   Mixed hyperlipidemia 10/03/2020   Overweight 10/03/2020   Personal history of colonic polyps 10/03/2020   Pure hypercholesterolemia 10/03/2020   Rectal bleeding 10/03/2020   Rectal pain 10/03/2020   Sleep disorder 10/03/2020   Prolapsed internal hemorrhoids, grade 3, s/p ligation/pexy/hemorrhoidectomy 05/30/2020 05/30/2020   History of total left knee replacement 12/17/2019   Appendicitis 06/30/2019   Osteoarthritis of knee 12/26/2017   Tubular adenoma of colon 2014    Earlie Counts, PT 03/26/21 3:33 PM  [facility] [address] [place], [place], [address] Phone: [phone]   Fax:  [phone]  Name: Jonathan Peters MRN: [id number] Date of Birth: 1956/09/23

## 2021-04-02 ENCOUNTER — Encounter: Payer: Self-pay | Admitting: Physical Therapy

## 2021-04-02 ENCOUNTER — Other Ambulatory Visit: Payer: Self-pay

## 2021-04-02 ENCOUNTER — Ambulatory Visit: Payer: Federal, State, Local not specified - PPO | Admitting: Physical Therapy

## 2021-04-02 DIAGNOSIS — M6281 Muscle weakness (generalized): Secondary | ICD-10-CM

## 2021-04-02 DIAGNOSIS — R152 Fecal urgency: Secondary | ICD-10-CM

## 2021-04-02 DIAGNOSIS — R278 Other lack of coordination: Secondary | ICD-10-CM

## 2021-04-02 NOTE — Therapy (Signed)
[facility] [address] [place], [place], [address] Phone: [phone]   Fax:  [phone]  Physical Therapy Treatment  Patient Details  Name: Jonathan Peters MRN: [id number] Date of Birth: 1956/05/13 Referring Provider (PT): Dr. Michael Boston   Encounter Date: 04/02/2021   PT End of Session - 04/02/21 1502     Visit Number 6    Date for PT Re-Evaluation 04/30/21    Authorization Type BCBS    Authorization - Visit Number 6    Authorization - Number of Visits 20    PT Start Time 8675    PT Stop Time 1523    PT Time Calculation (min) 38 min    Activity Tolerance Patient tolerated treatment well    Behavior During Therapy [facility] for tasks assessed/performed             Past Medical History:  Diagnosis Date   Arthritis    knees   Hypercholesterolemia    Hypertension    Neuroma of foot     Past Surgical History:  Procedure Laterality Date   COLONOSCOPY N/A 09/18/2013   Procedure: COLONOSCOPY;  Surgeon: Garlan Fair, MD;  [facility]: [facility];  Service: Endoscopy;  Laterality: N/A;   EVALUATION UNDER ANESTHESIA WITH HEMORRHOIDECTOMY N/A 05/30/2020   Procedure: HEMORRHOIDECTOMY WITH LIGATION AND HEMORRHOIDOPEXY ANORECTAL EXAM UNDER ANESTHESIA;  Surgeon: Michael Boston, MD;  Location: [facility];  Service: General;  Laterality: N/A;   FRACTURE SURGERY Left    ankle   HEMORRHOID SURGERY     LAPAROSCOPIC APPENDECTOMY N/A 06/30/2019   Procedure: APPENDECTOMY LAPAROSCOPIC;  Surgeon: Jules Husbands, MD;  Location: [facility];  Service: General;  Laterality: N/A;   TONSILLECTOMY     VASECTOMY      There were no vitals filed for this visit.   Subjective Assessment - 04/02/21 1449     Subjective I have used the wand several times. I am not using the linzess and just the Miralax. I drink alot of water.    Patient Stated Goals reduce urge    Currently in Pain? Yes    Pain Score 5     Pain Location Rectum    Pain  Orientation Mid    Pain Descriptors / Indicators Pressure    Pain Type Acute pain    Pain Onset More than a month ago    Pain Frequency Constant    Aggravating Factors  sitting    Pain Relieving Factors walking    Multiple Pain Sites No                            Pelvic Floor Special Questions - 04/02/21 0001     Pelvic Floor Internal Exam Patient confirms identification and approves PT to asses pelvic floor and treatment    Exam Type Rectal    Palpation he is able to bulge the pelvic floor and push the therapist finger out of the anal canal    Strength good squeeze, good lift, able to hold agaisnt strong resistance               [facility] Adult PT Treatment/Exercise - 04/02/21 0001       Self-Care   Self-Care Other Self-Care Comments    Other Self-Care Comments  discussed with patient on using the rectal wand, how long to use it and 3 times per week      Neuro Re-ed    Neuro  Re-ed Details  contract anus with tactile cues to perform a full circle for 5 seconds then have the patient bulge the pelvic floor to push her finger  out of the canal.      Manual Therapy   Manual Therapy Internal Pelvic Floor    Internal Pelvic Floor manual work to the pelvic floor muscles and mobilize the coccyx; trigger point release to the Obturator internist with contract relax, pressuer on the coccygeus with hip extension, release around the Aycock canal; release along the pelvic floor muscles with the patient going into quadruped  and moving in diagonals, and side to side to release the muscles   in prone over a bolster                   PT Education - 04/02/21 1524     Education Details Access Code: [email or web address]    Person(s) Educated Patient    Methods Explanation;Demonstration;Verbal cues;Handout    Comprehension Returned demonstration;Verbalized understanding              PT Short Term Goals - 03/02/21 1148       PT SHORT TERM GOAL #1   Title independent  with initial HEP    Time 4    Period Weeks    Status Achieved               PT Long Term Goals - 04/02/21 1532       PT LONG TERM GOAL #1   Title independent with advanced  HEP for core strength    Time 12    Period Weeks    Status On-going      PT LONG TERM GOAL #2   Title able to bulge his pelvic floor with correct breath when he is toileting to push his stool out    Time 12    Period Weeks    Status Achieved      PT LONG TERM GOAL #3   Title able to have a complete bowel movement due to adding abdominal massage to his routine    Baseline 3-4 bowel movements and not straining    Time 12    Period Weeks    Status On-going      PT LONG TERM GOAL #4   Title rectal urgency has decreased to having it when he is to have a bowel movement and not all the time    Time 12    Period Weeks    Status On-going                   Plan - 04/02/21 1529     Clinical Impression Statement Patient gets relief after manual work and has no pain. He has trigger points in the pelvic floor and does well with release when he is doing movement. Patient puborectalis relaxed when the therapist pressed on the sacrum and the bolster blocked the pubic bone. Patient has increased mobility of the coccyx. He always has no pain leaving therapy but after several days he will have the pressure in sitting. Patient will be working with the rectal wand to work on his pelvic floor trigger points. Patient will benefit from skilled therapy to improve pelvic floor coordinaiton for improve bowel movements.    Personal Factors and Comorbidities Comorbidity 1;Age    Comorbidities hemorrhoid surgery 05/30/2020    Examination-Activity Limitations Toileting    Examination-Participation Restrictions Community Activity    Stability/Clinical Decision Making Stable/Uncomplicated    Rehab Potential Excellent  PT Frequency 1x / week    PT Duration 12 weeks    PT Treatment/Interventions Biofeedback;ADLs/Self  Care Home Management;Neuromuscular re-education;Therapeutic exercise;Therapeutic activities;Patient/family education;Manual techniques;Dry needling    PT Next Visit Plan assess pelvic floor treatment with movement; check sacral mobility and coccyx mobilty    PT Home Exercise Plan Access Code: [email or web address]    Consulted and Agree with Plan of Care Patient             Patient will benefit from skilled therapeutic intervention in order to improve the following deficits and impairments:  Decreased coordination, Increased fascial restricitons, Decreased activity tolerance, Increased muscle spasms, Pain, Decreased mobility, Decreased strength  Visit Diagnosis: Muscle weakness (generalized)  Other lack of coordination  Rectal urgency     Problem List Patient Active Problem List   Diagnosis Date Noted   Cervical disc disease 10/03/2020   Chronic constipation 10/03/2020   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   Hypertension 10/03/2020   Hemorrhoids without complication 04/16/1600   Internal hemorrhoids 10/03/2020   Mixed hyperlipidemia 10/03/2020   Overweight 10/03/2020   Personal history of colonic polyps 10/03/2020   Pure hypercholesterolemia 10/03/2020   Rectal bleeding 10/03/2020   Rectal pain 10/03/2020   Sleep disorder 10/03/2020   Prolapsed internal hemorrhoids, grade 3, s/p ligation/pexy/hemorrhoidectomy 05/30/2020 05/30/2020   History of total left knee replacement 12/17/2019   Appendicitis 06/30/2019   Osteoarthritis of knee 12/26/2017   Tubular adenoma of colon 2014    Earlie Counts, PT 04/02/21 3:34 PM  [facility] [address] [place], [place], [address] Phone: [phone]   Fax:  [phone]  Name: ANGELDEJESUS CALLAHAM MRN: [id number] Date of Birth: August 14, 1956

## 2021-04-02 NOTE — Patient Instructions (Signed)
Access Code: [email or web address] URL: [email or web address] Date: 04/02/2021 Prepared by: Earlie Counts  Program Notes lay on your side and massage around the anus, between the gluteal folds.  Sit on a ball and massage the muscles between the sitting bone and coccyx bone use your cervical massager on the leg muscles.    Exercises Seated Pelvic Floor Contraction - 2 x daily - 7 x weekly - 1 sets - 10 reps [facility] [address] [place], [place] [address] Phone # [phone] Fax [phone]

## 2021-04-09 ENCOUNTER — Ambulatory Visit: Payer: Federal, State, Local not specified - PPO | Admitting: Physical Therapy

## 2021-04-09 ENCOUNTER — Encounter: Payer: Self-pay | Admitting: Physical Therapy

## 2021-04-09 ENCOUNTER — Other Ambulatory Visit: Payer: Self-pay

## 2021-04-09 DIAGNOSIS — R152 Fecal urgency: Secondary | ICD-10-CM | POA: Diagnosis not present

## 2021-04-09 DIAGNOSIS — M6281 Muscle weakness (generalized): Secondary | ICD-10-CM | POA: Diagnosis not present

## 2021-04-09 DIAGNOSIS — R278 Other lack of coordination: Secondary | ICD-10-CM | POA: Diagnosis not present

## 2021-04-09 NOTE — Patient Instructions (Signed)
Access Code: [email or web address] URL: [email or web address] Date: 04/09/2021 Prepared by: Earlie Counts  Program Notes lay on your side and massage around the anus, between the gluteal folds.  Sit on a ball and massage the muscles between the sitting bone and coccyx bone use your cervical massager on the leg muscles.    Exercises Supine Piriformis Stretch - 1 x daily - 7 x weekly - 1 sets - 2 reps - 30 sec hold [facility] [address] [place], [place] [address] Phone # [phone] Fax [phone]

## 2021-04-09 NOTE — Therapy (Signed)
[facility] [address], [place], [place], [address] Phone: [phone]   Fax:  [phone]  Physical Therapy Treatment  Patient Details  Name: Jonathan Peters MRN: [id number] Date of Birth: 25-Nov-1955 Referring Provider (PT): Dr. Michael Boston   Encounter Date: 04/09/2021   PT End of Session - 04/09/21 1444     Visit Number 7    Date for PT Re-Evaluation 04/30/21    Authorization Type BCBS    Authorization - Visit Number 7    Authorization - Number of Visits 20    PT Start Time 7867    PT Stop Time 1523    PT Time Calculation (min) 38 min    Activity Tolerance Patient tolerated treatment well    Behavior During Therapy [facility] for tasks assessed/performed             Past Medical History:  Diagnosis Date   Arthritis    knees   Hypercholesterolemia    Hypertension    Neuroma of foot     Past Surgical History:  Procedure Laterality Date   COLONOSCOPY N/A 09/18/2013   Procedure: COLONOSCOPY;  Surgeon: Garlan Fair, MD;  Location: [facility];  Service: Endoscopy;  Laterality: N/A;   EVALUATION UNDER ANESTHESIA WITH HEMORRHOIDECTOMY N/A 05/30/2020   Procedure: HEMORRHOIDECTOMY WITH LIGATION AND HEMORRHOIDOPEXY ANORECTAL EXAM UNDER ANESTHESIA;  Surgeon: Michael Boston, MD;  Location: [facility];  Service: General;  Laterality: N/A;   FRACTURE SURGERY Left    ankle   HEMORRHOID SURGERY     LAPAROSCOPIC APPENDECTOMY N/A 06/30/2019   Procedure: APPENDECTOMY LAPAROSCOPIC;  Surgeon: Jules Husbands, MD;  Location: [facility];  Service: General;  Laterality: N/A;   TONSILLECTOMY     VASECTOMY      There were no vitals filed for this visit.   Subjective Assessment - 04/09/21 1446     Subjective I am getting better with the wand and have used it 2 times.  Still the same. I am doing the exercises. I have good bowel movements now. .I still have the urge to have another bowel movement.  After therapy I feel better but  only lasts for 40 minutes. I am not using the Linzess anymore. I am only using the Miralax1 time per day.    Patient Stated Goals reduce urge    Pain Score 5     Pain Location Rectum    Pain Orientation Mid    Pain Descriptors / Indicators Pressure    Pain Type Acute pain    Pain Onset More than a month ago    Pain Frequency Constant    Aggravating Factors  sitting    Pain Relieving Factors walking    Multiple Pain Sites No                               [facility] Adult PT Treatment/Exercise - 04/09/21 0001       Lumbar Exercises: Stretches   Hip Flexor Stretch Right;Left;1 rep;30 seconds    Hip Flexor Stretch Limitations foot on top step    Piriformis Stretch Right;Left;1 rep    Piriformis Stretch Limitations supine; then do the trunk rotation holding 30 seconds each    Other Lumbar Stretch Exercise sitting hip adductor stretch holding for 30 seconds      Manual Therapy   Manual Therapy Soft tissue mobilization    Soft tissue mobilization using the addaday to the gluteal,  piriformis, levator ani, hamstring while in a piriformis stretch on his side to release all of the fascia.                    PT Education - 04/09/21 1530     Education Details Access Code: [email or web address]    Person(s) Educated Patient    Methods Explanation;Demonstration;Handout    Comprehension Verbalized understanding;Returned demonstration              PT Short Term Goals - 03/02/21 1148       PT SHORT TERM GOAL #1   Title independent with initial HEP    Time 4    Period Weeks    Status Achieved               PT Long Term Goals - 04/02/21 1532       PT LONG TERM GOAL #1   Title independent with advanced  HEP for core strength    Time 12    Period Weeks    Status On-going      PT LONG TERM GOAL #2   Title able to bulge his pelvic floor with correct breath when he is toileting to push his stool out    Time 12    Period Weeks    Status Achieved      PT  LONG TERM GOAL #3   Title able to have a complete bowel movement due to adding abdominal massage to his routine    Baseline 3-4 bowel movements and not straining    Time 12    Period Weeks    Status On-going      PT LONG TERM GOAL #4   Title rectal urgency has decreased to having it when he is to have a bowel movement and not all the time    Time 12    Period Weeks    Status On-going                   Plan - 04/09/21 1445     Clinical Impression Statement Patient had less pressure in the perineum after manual therapy and piriformis stretch. Patient has fascial tightness in the pelvic floor muscles, hip muscles, and hamstring. They respond well to the Addaday today. Patient walks with stiffness on the right side due to a knee he needs to be replaced which could contribute to some of the pelvic floor fascial tightness. Patient is using the rectal wand at home and is doing his HEP. Patient is able to have a bowel movement with greater ease but afterwards he continues to have the urge to void. Patient will benefit from skilled therapy to improve pelvic floor coordination for improved bowel movements.    Personal Factors and Comorbidities Comorbidity 1;Age    Comorbidities hemorrhoid surgery 05/30/2020    Examination-Activity Limitations Toileting    Examination-Participation Restrictions Community Activity    Stability/Clinical Decision Making Stable/Uncomplicated    Rehab Potential Excellent    PT Frequency 1x / week    PT Duration 12 weeks    PT Treatment/Interventions Biofeedback;ADLs/Self Care Home Management;Neuromuscular re-education;Therapeutic exercise;Therapeutic activities;Patient/family education;Manual techniques;Dry needling    PT Next Visit Plan see if the addaday has helped, continues with the stretches and manual work    PT Home Exercise Plan Access Code: [email or web address]    Consulted and Agree with Plan of Care Patient             Patient will benefit from skilled  therapeutic intervention  in order to improve the following deficits and impairments:  Decreased coordination, Increased fascial restricitons, Decreased activity tolerance, Increased muscle spasms, Pain, Decreased mobility, Decreased strength  Visit Diagnosis: Muscle weakness (generalized)  Other lack of coordination  Rectal urgency     Problem List Patient Active Problem List   Diagnosis Date Noted   Cervical disc disease 10/03/2020   Chronic constipation 10/03/2020   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   Hypertension 10/03/2020   Hemorrhoids without complication 07/24/1218   Internal hemorrhoids 10/03/2020   Mixed hyperlipidemia 10/03/2020   Overweight 10/03/2020   Personal history of colonic polyps 10/03/2020   Pure hypercholesterolemia 10/03/2020   Rectal bleeding 10/03/2020   Rectal pain 10/03/2020   Sleep disorder 10/03/2020   Prolapsed internal hemorrhoids, grade 3, s/p ligation/pexy/hemorrhoidectomy 05/30/2020 05/30/2020   History of total left knee replacement 12/17/2019   Appendicitis 06/30/2019   Osteoarthritis of knee 12/26/2017   Tubular adenoma of colon 2014    Earlie Counts, PT 04/09/21 3:48 PM  [facility] [address] [place], [place], [address] Phone: [phone]   Fax:  [phone]  Name: SEARCY MIYOSHI MRN: [id number] Date of Birth: May 01, 1956

## 2021-04-16 ENCOUNTER — Encounter: Payer: Self-pay | Admitting: Physical Therapy

## 2021-04-16 ENCOUNTER — Ambulatory Visit: Payer: Federal, State, Local not specified - PPO | Admitting: Physical Therapy

## 2021-04-16 ENCOUNTER — Other Ambulatory Visit: Payer: Self-pay

## 2021-04-16 DIAGNOSIS — R278 Other lack of coordination: Secondary | ICD-10-CM | POA: Diagnosis not present

## 2021-04-16 DIAGNOSIS — M6281 Muscle weakness (generalized): Secondary | ICD-10-CM | POA: Diagnosis not present

## 2021-04-16 DIAGNOSIS — R152 Fecal urgency: Secondary | ICD-10-CM | POA: Diagnosis not present

## 2021-04-16 NOTE — Therapy (Signed)
[facility] [address] [place], [place], [address] Phone: [phone]   Fax:  [phone]  Physical Therapy Treatment  Patient Details  Name: Jonathan Peters MRN: [id number] Date of Birth: 03-Oct-1956 Referring Provider (PT): Dr. Michael Boston   Encounter Date: 04/16/2021   PT End of Session - 04/16/21 1359     Visit Number 8    Date for PT Re-Evaluation 04/30/21    Authorization Type BCBS    Authorization - Visit Number 8    Authorization - Number of Visits 20    PT Start Time 1400    PT Stop Time 1440    PT Time Calculation (min) 40 min    Activity Tolerance Patient tolerated treatment well    Behavior During Therapy [facility] for tasks assessed/performed             Past Medical History:  Diagnosis Date   Arthritis    knees   Hypercholesterolemia    Hypertension    Neuroma of foot     Past Surgical History:  Procedure Laterality Date   COLONOSCOPY N/A 09/18/2013   Procedure: COLONOSCOPY;  Surgeon: Garlan Fair, MD;  Location: [facility];  Service: Endoscopy;  Laterality: N/A;   EVALUATION UNDER ANESTHESIA WITH HEMORRHOIDECTOMY N/A 05/30/2020   Procedure: HEMORRHOIDECTOMY WITH LIGATION AND HEMORRHOIDOPEXY ANORECTAL EXAM UNDER ANESTHESIA;  Surgeon: Michael Boston, MD;  Location: [facility];  Service: General;  Laterality: N/A;   FRACTURE SURGERY Left    ankle   HEMORRHOID SURGERY     LAPAROSCOPIC APPENDECTOMY N/A 06/30/2019   Procedure: APPENDECTOMY LAPAROSCOPIC;  Surgeon: Jules Husbands, MD;  Location: [facility];  Service: General;  Laterality: N/A;   TONSILLECTOMY     VASECTOMY      There were no vitals filed for this visit.   Subjective Assessment - 04/16/21 1402     Subjective No changes. I have a constant discomfort.    Patient Stated Goals reduce urge    Currently in Pain? Yes    Pain Score 5     Pain Location Rectum    Pain Orientation Mid    Pain Descriptors / Indicators Pressure    Pain  Type Acute pain    Pain Onset More than a month ago    Pain Frequency Constant    Aggravating Factors  sitting    Pain Relieving Factors walking    Multiple Pain Sites No                [facility] PT Assessment - 04/16/21 0001       Assessment   Medical Diagnosis R15.2 Rectal Urgency    Referring Provider (PT) Dr. Michael Boston    Onset Date/Surgical Date --   05/30/2020   Prior Therapy none      Precautions   Precautions None      Restrictions   Weight Bearing Restrictions No      Home Environment   Living Environment Private residence      Prior Function   Level of Independence Independent    Vocation Retired    Leisure walk 5 time per week      Cognition   Overall Cognitive Status Within Functional Limits for tasks assessed      Posture/Postural Control   Posture/Postural Control Postural limitations    Postural Limitations Forward head;Rounded Shoulders    Posture Comments pelvis shifted to the left  Pelvic Floor Special Questions - 04/16/21 0001     Pelvic Floor Internal Exam Patient confirms identification and approves PT to asses pelvic floor and treatment    Exam Type Rectal    Strength good squeeze, good lift, able to hold agaisnt strong resistance               [facility] Adult PT Treatment/Exercise - 04/16/21 0001       Self-Care   Self-Care Other Self-Care Comments    Other Self-Care Comments  discussed with patient on how the pain can change the brain and when he is doing things, he is not aware of the discomfort as much. Discussed things he can do that he stopped due to the discomfort. Discussed with him to resume his meditation.      Exercises   Exercises Other Exercises    Other Exercises  verbally discussed with patienton him using the wand 3 times per week and that he is dong a good job due to no trigger points in the pelvic floor, Patient is doing his current HEP without difficulty      Lumbar Exercises:  Stretches   Active Hamstring Stretch Right;Left;30 seconds;2 reps    Active Hamstring Stretch Limitations supine with strap    ITB Stretch Right;Left;1 rep;30 seconds    ITB Stretch Limitations supine with strap    Piriformis Stretch Right;Left;1 rep    Piriformis Stretch Limitations supine with strap      Manual Therapy   Manual Therapy Internal Pelvic Floor    Internal Pelvic Floor manual work to bilateral levator ani and puborectlais with no trigger points palpable, Increased ability to place therapist index finger into the anal canal                      PT Short Term Goals - 04/16/21 1425       PT SHORT TERM GOAL #1   Title independent with initial HEP    Time 4    Period Weeks    Status Achieved               PT Long Term Goals - 04/16/21 1425       PT LONG TERM GOAL #1   Title independent with advanced  HEP for core strength    Time 12    Period Weeks    Status Achieved      PT LONG TERM GOAL #2   Title able to bulge his pelvic floor with correct breath when he is toileting to push his stool out    Time 12    Period Weeks    Status Achieved      PT LONG TERM GOAL #3   Title able to have a complete bowel movement due to adding abdominal massage to his routine    Time 12    Period Weeks    Status Partially Met      PT LONG TERM GOAL #4   Title rectal urgency has decreased to having it when he is to have a bowel movement and not all the time    Time 12    Period Weeks    Status Not Met                   Plan - 04/16/21 1416     Clinical Impression Statement Patient continues to have the urge to have a bowel movement but not as intense and when he is busy the intensity  is not as much. He will have no pain with exercise and after manual work. Patient is using a rectal wand to work on the trigger points in the pelvic floor. Patient rectal strength is 4/5 and able to push the therapist finger out of the anal canal. Patient will have  1-2 bowel movements per day. He is able to bulge his pelvic floor. Patient is independent with his HEP. He is discharged due to the urge has been present after 8 visits.    Personal Factors and Comorbidities Comorbidity 1;Age    Comorbidities hemorrhoid surgery 05/30/2020    Examination-Activity Limitations Toileting    Examination-Participation Restrictions Community Activity    Stability/Clinical Decision Making Stable/Uncomplicated    Rehab Potential Excellent    PT Treatment/Interventions Biofeedback;ADLs/Self Care Home Management;Neuromuscular re-education;Therapeutic exercise;Therapeutic activities;Patient/family education;Manual techniques;Dry needling    PT Next Visit Plan discharge to HEP    PT Home Exercise Plan Access Code: [email or web address]    Consulted and Agree with Plan of Care Patient             Patient will benefit from skilled therapeutic intervention in order to improve the following deficits and impairments:  Decreased coordination, Increased fascial restricitons, Decreased activity tolerance, Increased muscle spasms, Pain, Decreased mobility, Decreased strength  Visit Diagnosis: Muscle weakness (generalized)  Other lack of coordination  Rectal urgency     Problem List Patient Active Problem List   Diagnosis Date Noted   Cervical disc disease 10/03/2020   Chronic constipation 10/03/2020   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   Hypertension 10/03/2020   Hemorrhoids without complication 73/41/9379   Internal hemorrhoids 10/03/2020   Mixed hyperlipidemia 10/03/2020   Overweight 10/03/2020   Personal history of colonic polyps 10/03/2020   Pure hypercholesterolemia 10/03/2020   Rectal bleeding 10/03/2020   Rectal pain 10/03/2020   Sleep disorder 10/03/2020   Prolapsed internal hemorrhoids, grade 3, s/p ligation/pexy/hemorrhoidectomy 05/30/2020 05/30/2020   History of total left knee replacement 12/17/2019   Appendicitis 06/30/2019   Osteoarthritis of knee 12/26/2017    Tubular adenoma of colon 2014    Earlie Counts, PT 04/16/21 2:45 PM  [facility] [address], [place], [place], [address] Phone: [phone]   Fax:  [phone]  Name: Jonathan Peters MRN: [id number] Date of Birth: 08-Mar-1956 PHYSICAL THERAPY DISCHARGE SUMMARY  Visits from Start of Care: 8  Current functional level related to goals / functional outcomes: See above.  He has met all goals except for the constant urgency feeling of the rectum.    Remaining deficits: See above.    Education / Equipment: HEP   Patient agrees to discharge. Patient goals were partially met. Patient is being discharged due to urgency continues to be present after 8 visits. Thank you for the referral. Earlie Counts, PT 04/16/21 2:46 PM

## 2021-04-23 ENCOUNTER — Encounter: Payer: Federal, State, Local not specified - PPO | Admitting: Physical Therapy

## 2021-04-30 ENCOUNTER — Ambulatory Visit: Payer: Federal, State, Local not specified - PPO | Admitting: Physical Therapy

## 2021-05-07 DIAGNOSIS — Z9889 Other specified postprocedural states: Secondary | ICD-10-CM | POA: Diagnosis not present

## 2021-05-07 DIAGNOSIS — R198 Other specified symptoms and signs involving the digestive system and abdomen: Secondary | ICD-10-CM | POA: Diagnosis not present

## 2021-05-26 ENCOUNTER — Ambulatory Visit: Payer: Federal, State, Local not specified - PPO | Admitting: Podiatry

## 2021-05-26 ENCOUNTER — Other Ambulatory Visit: Payer: Self-pay

## 2021-05-26 ENCOUNTER — Ambulatory Visit (INDEPENDENT_AMBULATORY_CARE_PROVIDER_SITE_OTHER): Payer: Federal, State, Local not specified - PPO

## 2021-05-26 DIAGNOSIS — D3610 Benign neoplasm of peripheral nerves and autonomic nervous system, unspecified: Secondary | ICD-10-CM | POA: Diagnosis not present

## 2021-05-26 DIAGNOSIS — M79672 Pain in left foot: Secondary | ICD-10-CM | POA: Diagnosis not present

## 2021-05-26 DIAGNOSIS — G5762 Lesion of plantar nerve, left lower limb: Secondary | ICD-10-CM

## 2021-05-26 DIAGNOSIS — G5782 Other specified mononeuropathies of left lower limb: Secondary | ICD-10-CM

## 2021-05-26 NOTE — Progress Notes (Signed)
  Subjective:  Patient ID: Jonathan Peters, male    DOB: 1956-09-06,  MRN: [id number]  Chief Complaint  Patient presents with   Foot Pain    Left foot neuroma    65 y.o. male presents with the above complaint. History confirmed with patient. States the pain is still there along with numbness. The injections did not significantly help and his pain is back to what it previously was.  Objective:  Physical Exam: warm, good capillary refill, no trophic changes or ulcerative lesions, normal DP and PT pulses and normal sensory exam. Left Foot: POP left 3rd interspace with Mulder's click.   XR: Taken and reviewed decreased 2nd/3rd IMA, no acute fractures or dislocations. Assessment:   1. Neuroma   2. 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 Left foot pain    Plan:  Patient was evaluated and treated and all questions answered.  Morton Neuroma -Pain still persists. He has had over 6 months of symptoms and failed conservative therapy of injections, shoe gear modification, anti-inflammatory medications. Order MRI to evaluate for likely surgery -Will consider surgical excision    Return in about 3 weeks (around 06/16/2021) for MRI f/u.

## 2021-06-11 ENCOUNTER — Ambulatory Visit
Admission: RE | Admit: 2021-06-11 | Discharge: 2021-06-11 | Disposition: A | Discharge status: Home / Self Care | Payer: Federal, State, Local not specified - PPO | Source: Ambulatory Visit | Attending: Podiatry | Admitting: Podiatry

## 2021-06-11 ENCOUNTER — Other Ambulatory Visit: Payer: Self-pay

## 2021-06-11 DIAGNOSIS — M19072 Primary osteoarthritis, left ankle and foot: Secondary | ICD-10-CM | POA: Diagnosis not present

## 2021-06-11 DIAGNOSIS — M25475 Effusion, left foot: Secondary | ICD-10-CM | POA: Diagnosis not present

## 2021-06-11 DIAGNOSIS — D3610 Benign neoplasm of peripheral nerves and autonomic nervous system, unspecified: Secondary | ICD-10-CM

## 2021-06-11 MED ORDER — GADOBENATE DIMEGLUMINE 529 MG/ML IV SOLN
18.0000 mL | Freq: Once | INTRAVENOUS | Status: AC | PRN
  Administered 2021-06-11: 18 mL via INTRAVENOUS

## 2021-06-16 ENCOUNTER — Ambulatory Visit: Payer: Federal, State, Local not specified - PPO | Admitting: Podiatry

## 2021-06-16 ENCOUNTER — Other Ambulatory Visit: Payer: Self-pay

## 2021-06-16 DIAGNOSIS — G5762 Lesion of plantar nerve, left lower limb: Secondary | ICD-10-CM

## 2021-06-16 DIAGNOSIS — M25475 Effusion, left foot: Secondary | ICD-10-CM | POA: Diagnosis not present

## 2021-06-16 DIAGNOSIS — G5782 Other specified mononeuropathies of left lower limb: Secondary | ICD-10-CM

## 2021-06-16 MED ORDER — TRIAMCINOLONE ACETONIDE 40 MG/ML IJ SUSP
20.0000 mg | Freq: Once | INTRAMUSCULAR | Status: AC
  Administered 2021-06-16: 20 mg

## 2021-06-16 NOTE — Progress Notes (Signed)
  Subjective:  Patient ID: Jonathan Peters, male    DOB: 1956/10/15,  MRN: [id number]  Chief Complaint  Patient presents with   MRI Results Review    Follow up for left foot MRI results review.    65 y.o. male presents with the above complaint. History confirmed with patient. States the foot is doing ok not having a lot of pain more numbness at the area.  Objective:  Physical Exam: warm, good capillary refill, no trophic changes or ulcerative lesions, normal DP and PT pulses and normal sensory exam. Left Foot: POP left 3rd interspace with Mulder's click.    MRI 8/25   Narrative & Impression  CLINICAL DATA:  Foot pain, chronic, Mortons neuroma suspected, xray done   EXAM: MRI OF THE LEFT FOREFOOT WITHOUT AND WITH CONTRAST   TECHNIQUE: Multiplanar, multisequence MR imaging of the left forefoot was performed both before and after administration of intravenous contrast.   CONTRAST:  55m MULTIHANCE GADOBENATE DIMEGLUMINE 529 MG/ML IV SOLN   COMPARISON:  X-ray 03/13/2020   FINDINGS: Bones/Joint/Cartilage   No acute fracture. No dislocation. Mild first MTP joint space narrowing with tiny marginal osteophytes. Small first MTP joint effusion, nonspecific. Mild arthropathy of the hallux-sesamoid complex laterally. Remaining joint spaces of the forefoot are relatively well preserved. No erosions. No bone marrow edema or periostitis.   Ligaments   Intact Lisfranc ligament. Collateral ligaments of the forefoot are intact. Intact plantar plates.   Muscles and Tendons   Intact flexor and extensor tendons without tendinosis, tear, or tenosynovitis. Normal muscle bulk and signal intensity without edema, atrophy, or fatty infiltration.   Soft tissues   No intermetatarsal space mass or fluid collection. No soft tissue edema. No fluid collections. No ulceration. No abnormal postcontrast enhancement.   IMPRESSION: 1. No evidence of Morton's neuroma. 2. 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 3. Small first MTP joint effusion, nonspecific.     Assessment:   1. 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  Morton Neuroma -Reviewed MRI - no evidence of large enough neuroma to warrant excision -Repeat injection today  Procedure: Neuroma Injection Location: Left 3rd interspace Skin Prep: Alcohol. Injectate: 0.5 cc 0.5% marcaine plain, 0.5 cc Kenalog 40 Disposition: Patient tolerated procedure well. Injection site dressed with a band-aid.  Joint effusion left -Noted on MRI -Likely of little clinical significance  Return in about 3 weeks (around 07/07/2021).

## 2021-07-09 DIAGNOSIS — R198 Other specified symptoms and signs involving the digestive system and abdomen: Secondary | ICD-10-CM | POA: Diagnosis not present

## 2021-07-09 DIAGNOSIS — Z881 Allergy status to other antibiotic agents status: Secondary | ICD-10-CM | POA: Diagnosis not present

## 2021-07-09 DIAGNOSIS — Z885 Allergy status to narcotic agent status: Secondary | ICD-10-CM | POA: Diagnosis not present

## 2021-07-09 DIAGNOSIS — K5730 Diverticulosis of large intestine without perforation or abscess without bleeding: Secondary | ICD-10-CM | POA: Diagnosis not present

## 2021-07-09 DIAGNOSIS — Z9889 Other specified postprocedural states: Secondary | ICD-10-CM | POA: Diagnosis not present

## 2021-07-09 DIAGNOSIS — K6289 Other specified diseases of anus and rectum: Secondary | ICD-10-CM | POA: Diagnosis not present

## 2021-07-09 DIAGNOSIS — I10 Essential (primary) hypertension: Secondary | ICD-10-CM | POA: Diagnosis not present

## 2021-07-09 DIAGNOSIS — K6389 Other specified diseases of intestine: Secondary | ICD-10-CM | POA: Diagnosis not present

## 2021-07-09 DIAGNOSIS — Z79899 Other long term (current) drug therapy: Secondary | ICD-10-CM | POA: Diagnosis not present

## 2021-08-17 DIAGNOSIS — K5900 Constipation, unspecified: Secondary | ICD-10-CM | POA: Diagnosis not present

## 2021-08-17 DIAGNOSIS — Z79899 Other long term (current) drug therapy: Secondary | ICD-10-CM | POA: Diagnosis not present

## 2021-08-17 DIAGNOSIS — R198 Other specified symptoms and signs involving the digestive system and abdomen: Secondary | ICD-10-CM | POA: Diagnosis not present

## 2021-08-17 DIAGNOSIS — Z885 Allergy status to narcotic agent status: Secondary | ICD-10-CM | POA: Diagnosis not present

## 2021-08-17 DIAGNOSIS — R150 Incomplete defecation: Secondary | ICD-10-CM | POA: Diagnosis not present

## 2021-08-17 DIAGNOSIS — E7800 Pure hypercholesterolemia, unspecified: Secondary | ICD-10-CM | POA: Diagnosis not present

## 2021-08-17 DIAGNOSIS — Z86010 Personal history of colonic polyps: Secondary | ICD-10-CM | POA: Diagnosis not present

## 2021-08-17 DIAGNOSIS — I10 Essential (primary) hypertension: Secondary | ICD-10-CM | POA: Diagnosis not present

## 2021-08-20 DIAGNOSIS — M6289 Other specified disorders of muscle: Secondary | ICD-10-CM | POA: Diagnosis not present

## 2021-08-20 DIAGNOSIS — R198 Other specified symptoms and signs involving the digestive system and abdomen: Secondary | ICD-10-CM | POA: Diagnosis not present

## 2021-08-20 DIAGNOSIS — K5989 Other specified functional intestinal disorders: Secondary | ICD-10-CM | POA: Diagnosis not present

## 2021-09-03 DIAGNOSIS — M1711 Unilateral primary osteoarthritis, right knee: Secondary | ICD-10-CM | POA: Diagnosis not present

## 2021-10-02 DIAGNOSIS — I10 Essential (primary) hypertension: Secondary | ICD-10-CM | POA: Diagnosis not present

## 2021-10-02 DIAGNOSIS — M1711 Unilateral primary osteoarthritis, right knee: Secondary | ICD-10-CM | POA: Diagnosis not present

## 2021-10-02 DIAGNOSIS — E7800 Pure hypercholesterolemia, unspecified: Secondary | ICD-10-CM | POA: Diagnosis not present

## 2021-10-22 DIAGNOSIS — M6289 Other specified disorders of muscle: Secondary | ICD-10-CM | POA: Diagnosis not present

## 2021-10-22 DIAGNOSIS — K5902 Outlet dysfunction constipation: Secondary | ICD-10-CM | POA: Diagnosis not present

## 2021-10-22 DIAGNOSIS — R198 Other specified symptoms and signs involving the digestive system and abdomen: Secondary | ICD-10-CM | POA: Diagnosis not present

## 2021-10-22 DIAGNOSIS — M62838 Other muscle spasm: Secondary | ICD-10-CM | POA: Diagnosis not present

## 2021-10-29 DIAGNOSIS — M62838 Other muscle spasm: Secondary | ICD-10-CM | POA: Diagnosis not present

## 2021-10-29 DIAGNOSIS — M6289 Other specified disorders of muscle: Secondary | ICD-10-CM | POA: Diagnosis not present

## 2021-10-29 DIAGNOSIS — R198 Other specified symptoms and signs involving the digestive system and abdomen: Secondary | ICD-10-CM | POA: Diagnosis not present

## 2021-10-29 DIAGNOSIS — K5902 Outlet dysfunction constipation: Secondary | ICD-10-CM | POA: Diagnosis not present

## 2021-11-03 DIAGNOSIS — M6289 Other specified disorders of muscle: Secondary | ICD-10-CM | POA: Diagnosis not present

## 2021-11-03 DIAGNOSIS — M62838 Other muscle spasm: Secondary | ICD-10-CM | POA: Diagnosis not present

## 2021-11-03 DIAGNOSIS — R198 Other specified symptoms and signs involving the digestive system and abdomen: Secondary | ICD-10-CM | POA: Diagnosis not present

## 2021-11-03 DIAGNOSIS — K5902 Outlet dysfunction constipation: Secondary | ICD-10-CM | POA: Diagnosis not present

## 2021-11-12 DIAGNOSIS — M6289 Other specified disorders of muscle: Secondary | ICD-10-CM | POA: Diagnosis not present

## 2021-11-12 DIAGNOSIS — K5902 Outlet dysfunction constipation: Secondary | ICD-10-CM | POA: Diagnosis not present

## 2021-11-12 DIAGNOSIS — M62838 Other muscle spasm: Secondary | ICD-10-CM | POA: Diagnosis not present

## 2021-11-12 DIAGNOSIS — R198 Other specified symptoms and signs involving the digestive system and abdomen: Secondary | ICD-10-CM | POA: Diagnosis not present

## 2021-11-16 DIAGNOSIS — K5902 Outlet dysfunction constipation: Secondary | ICD-10-CM | POA: Diagnosis not present

## 2021-11-16 DIAGNOSIS — R198 Other specified symptoms and signs involving the digestive system and abdomen: Secondary | ICD-10-CM | POA: Diagnosis not present

## 2021-11-16 DIAGNOSIS — M6289 Other specified disorders of muscle: Secondary | ICD-10-CM | POA: Diagnosis not present

## 2021-11-16 DIAGNOSIS — M62838 Other muscle spasm: Secondary | ICD-10-CM | POA: Diagnosis not present

## 2021-11-23 DIAGNOSIS — M6289 Other specified disorders of muscle: Secondary | ICD-10-CM | POA: Diagnosis not present

## 2021-11-23 DIAGNOSIS — K5902 Outlet dysfunction constipation: Secondary | ICD-10-CM | POA: Diagnosis not present

## 2021-11-23 DIAGNOSIS — R198 Other specified symptoms and signs involving the digestive system and abdomen: Secondary | ICD-10-CM | POA: Diagnosis not present

## 2021-11-23 DIAGNOSIS — M62838 Other muscle spasm: Secondary | ICD-10-CM | POA: Diagnosis not present

## 2021-11-25 DIAGNOSIS — M1711 Unilateral primary osteoarthritis, right knee: Secondary | ICD-10-CM | POA: Diagnosis not present

## 2021-12-02 DIAGNOSIS — M25661 Stiffness of right knee, not elsewhere classified: Secondary | ICD-10-CM | POA: Diagnosis not present

## 2021-12-02 DIAGNOSIS — M25561 Pain in right knee: Secondary | ICD-10-CM | POA: Diagnosis not present

## 2021-12-07 DIAGNOSIS — M25561 Pain in right knee: Secondary | ICD-10-CM | POA: Diagnosis not present

## 2021-12-07 DIAGNOSIS — M25661 Stiffness of right knee, not elsewhere classified: Secondary | ICD-10-CM | POA: Diagnosis not present

## 2021-12-09 DIAGNOSIS — M25561 Pain in right knee: Secondary | ICD-10-CM | POA: Diagnosis not present

## 2021-12-09 DIAGNOSIS — M25661 Stiffness of right knee, not elsewhere classified: Secondary | ICD-10-CM | POA: Diagnosis not present

## 2021-12-11 DIAGNOSIS — M25661 Stiffness of right knee, not elsewhere classified: Secondary | ICD-10-CM | POA: Diagnosis not present

## 2021-12-11 DIAGNOSIS — M25561 Pain in right knee: Secondary | ICD-10-CM | POA: Diagnosis not present

## 2021-12-14 DIAGNOSIS — M25661 Stiffness of right knee, not elsewhere classified: Secondary | ICD-10-CM | POA: Diagnosis not present

## 2021-12-14 DIAGNOSIS — M25561 Pain in right knee: Secondary | ICD-10-CM | POA: Diagnosis not present

## 2021-12-15 DIAGNOSIS — M79675 Pain in left toe(s): Secondary | ICD-10-CM | POA: Diagnosis not present

## 2021-12-16 DIAGNOSIS — M25661 Stiffness of right knee, not elsewhere classified: Secondary | ICD-10-CM | POA: Diagnosis not present

## 2021-12-16 DIAGNOSIS — M25561 Pain in right knee: Secondary | ICD-10-CM | POA: Diagnosis not present

## 2021-12-18 DIAGNOSIS — M25661 Stiffness of right knee, not elsewhere classified: Secondary | ICD-10-CM | POA: Diagnosis not present

## 2021-12-18 DIAGNOSIS — M25561 Pain in right knee: Secondary | ICD-10-CM | POA: Diagnosis not present

## 2021-12-21 DIAGNOSIS — M25561 Pain in right knee: Secondary | ICD-10-CM | POA: Diagnosis not present

## 2021-12-21 DIAGNOSIS — M25661 Stiffness of right knee, not elsewhere classified: Secondary | ICD-10-CM | POA: Diagnosis not present

## 2021-12-23 DIAGNOSIS — M25561 Pain in right knee: Secondary | ICD-10-CM | POA: Diagnosis not present

## 2021-12-23 DIAGNOSIS — M25661 Stiffness of right knee, not elsewhere classified: Secondary | ICD-10-CM | POA: Diagnosis not present

## 2021-12-24 DIAGNOSIS — K5902 Outlet dysfunction constipation: Secondary | ICD-10-CM | POA: Diagnosis not present

## 2021-12-24 DIAGNOSIS — M6289 Other specified disorders of muscle: Secondary | ICD-10-CM | POA: Diagnosis not present

## 2021-12-24 DIAGNOSIS — M62838 Other muscle spasm: Secondary | ICD-10-CM | POA: Diagnosis not present

## 2021-12-24 DIAGNOSIS — R198 Other specified symptoms and signs involving the digestive system and abdomen: Secondary | ICD-10-CM | POA: Diagnosis not present

## 2021-12-25 DIAGNOSIS — M25661 Stiffness of right knee, not elsewhere classified: Secondary | ICD-10-CM | POA: Diagnosis not present

## 2021-12-25 DIAGNOSIS — M25561 Pain in right knee: Secondary | ICD-10-CM | POA: Diagnosis not present

## 2021-12-28 DIAGNOSIS — M25661 Stiffness of right knee, not elsewhere classified: Secondary | ICD-10-CM | POA: Diagnosis not present

## 2021-12-28 DIAGNOSIS — M25561 Pain in right knee: Secondary | ICD-10-CM | POA: Diagnosis not present

## 2021-12-29 DIAGNOSIS — Z4789 Encounter for other orthopedic aftercare: Secondary | ICD-10-CM | POA: Diagnosis not present

## 2021-12-30 DIAGNOSIS — M25661 Stiffness of right knee, not elsewhere classified: Secondary | ICD-10-CM | POA: Diagnosis not present

## 2021-12-30 DIAGNOSIS — M25561 Pain in right knee: Secondary | ICD-10-CM | POA: Diagnosis not present

## 2021-12-31 DIAGNOSIS — R198 Other specified symptoms and signs involving the digestive system and abdomen: Secondary | ICD-10-CM | POA: Diagnosis not present

## 2021-12-31 DIAGNOSIS — K5902 Outlet dysfunction constipation: Secondary | ICD-10-CM | POA: Diagnosis not present

## 2021-12-31 DIAGNOSIS — M62838 Other muscle spasm: Secondary | ICD-10-CM | POA: Diagnosis not present

## 2021-12-31 DIAGNOSIS — M6289 Other specified disorders of muscle: Secondary | ICD-10-CM | POA: Diagnosis not present

## 2022-01-07 DIAGNOSIS — M6289 Other specified disorders of muscle: Secondary | ICD-10-CM | POA: Diagnosis not present

## 2022-01-07 DIAGNOSIS — K5902 Outlet dysfunction constipation: Secondary | ICD-10-CM | POA: Diagnosis not present

## 2022-01-07 DIAGNOSIS — R198 Other specified symptoms and signs involving the digestive system and abdomen: Secondary | ICD-10-CM | POA: Diagnosis not present

## 2022-01-07 DIAGNOSIS — M62838 Other muscle spasm: Secondary | ICD-10-CM | POA: Diagnosis not present

## 2022-01-14 DIAGNOSIS — R198 Other specified symptoms and signs involving the digestive system and abdomen: Secondary | ICD-10-CM | POA: Diagnosis not present

## 2022-01-14 DIAGNOSIS — M6289 Other specified disorders of muscle: Secondary | ICD-10-CM | POA: Diagnosis not present

## 2022-01-14 DIAGNOSIS — K5902 Outlet dysfunction constipation: Secondary | ICD-10-CM | POA: Diagnosis not present

## 2022-01-14 DIAGNOSIS — M62838 Other muscle spasm: Secondary | ICD-10-CM | POA: Diagnosis not present

## 2022-01-28 DIAGNOSIS — R208 Other disturbances of skin sensation: Secondary | ICD-10-CM | POA: Diagnosis not present

## 2022-01-28 DIAGNOSIS — K5902 Outlet dysfunction constipation: Secondary | ICD-10-CM | POA: Diagnosis not present

## 2022-01-28 DIAGNOSIS — R198 Other specified symptoms and signs involving the digestive system and abdomen: Secondary | ICD-10-CM | POA: Diagnosis not present

## 2022-01-28 DIAGNOSIS — M62838 Other muscle spasm: Secondary | ICD-10-CM | POA: Diagnosis not present

## 2022-01-28 DIAGNOSIS — M6289 Other specified disorders of muscle: Secondary | ICD-10-CM | POA: Diagnosis not present

## 2022-01-28 DIAGNOSIS — Z8719 Personal history of other diseases of the digestive system: Secondary | ICD-10-CM | POA: Diagnosis not present

## 2022-02-08 DIAGNOSIS — K5902 Outlet dysfunction constipation: Secondary | ICD-10-CM | POA: Diagnosis not present

## 2022-02-08 DIAGNOSIS — R198 Other specified symptoms and signs involving the digestive system and abdomen: Secondary | ICD-10-CM | POA: Diagnosis not present

## 2022-02-08 DIAGNOSIS — M6289 Other specified disorders of muscle: Secondary | ICD-10-CM | POA: Diagnosis not present

## 2022-02-08 DIAGNOSIS — M62838 Other muscle spasm: Secondary | ICD-10-CM | POA: Diagnosis not present

## 2022-02-25 DIAGNOSIS — K5900 Constipation, unspecified: Secondary | ICD-10-CM | POA: Diagnosis not present

## 2022-02-25 DIAGNOSIS — E7800 Pure hypercholesterolemia, unspecified: Secondary | ICD-10-CM | POA: Diagnosis not present

## 2022-02-25 DIAGNOSIS — I10 Essential (primary) hypertension: Secondary | ICD-10-CM | POA: Diagnosis not present

## 2022-02-25 DIAGNOSIS — Z0000 Encounter for general adult medical examination without abnormal findings: Secondary | ICD-10-CM | POA: Diagnosis not present

## 2022-02-25 DIAGNOSIS — Z125 Encounter for screening for malignant neoplasm of prostate: Secondary | ICD-10-CM | POA: Diagnosis not present

## 2022-08-10 DIAGNOSIS — G5762 Lesion of plantar nerve, left lower limb: Secondary | ICD-10-CM | POA: Diagnosis not present

## 2022-08-10 DIAGNOSIS — M79672 Pain in left foot: Secondary | ICD-10-CM | POA: Diagnosis not present

## 2022-08-23 IMAGING — MR MR FOOT*L* WO/W CM
8 series · 40 of 40 positions shown · IV contrast (multihance)
Comparison: X-ray 03/13/2020

CLINICAL DATA: Foot pain, chronic, Ayongz\TIGER92\s neuroma suspected,
xray done

EXAM:
MRI OF THE LEFT FOREFOOT WITHOUT AND WITH CONTRAST
TECHNIQUE: Multiplanar, multisequence MR imaging of the left forefoot was
performed both before and after administration of intravenous
contrast.
CONTRAST:  18mL MULTIHANCE GADOBENATE DIMEGLUMINE 529 MG/ML IV SOLN

[Series 5: T1 · coronal · 3.0mm · 0.47mm/px · 7 of 39 slices shown (1 of 2)]
[im 1/39]
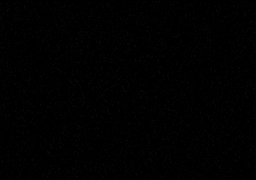
[im 7/39]
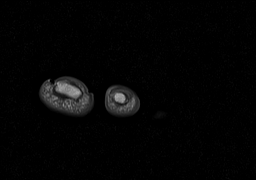
[im 13/39]
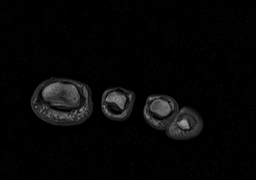
[im 20/39]
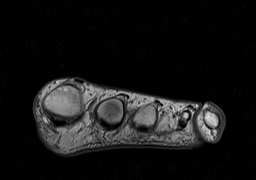
[im 26/39]
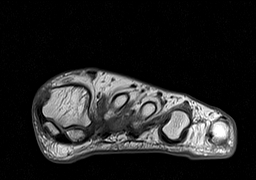
[im 32/39]
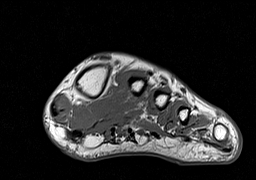
[im 39/39]
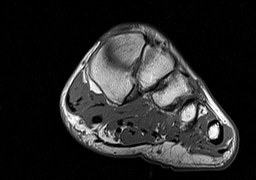

[Series 6: T2 fat-sat · coronal · 3.0mm · 0.47mm/px · 7 of 38 slices shown (1 of 2)]
[im 1/38]
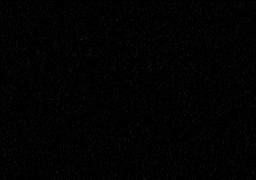
[im 7/38]
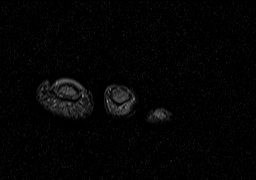
[im 13/38]
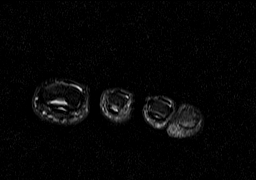
[im 19/38]
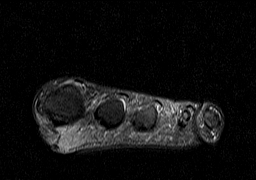
[im 25/38]
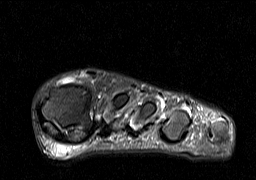
[im 31/38]
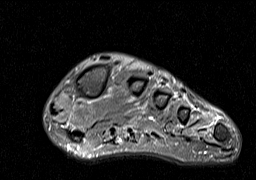
[im 38/38]
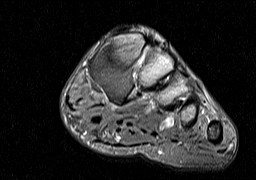

[Series 7: T2 fat-sat · axial · 3.0mm · 0.62mm/px · z∈[-56,+10]mm · 3 of 18 slices shown (2 of 2)]
[im 1/18]
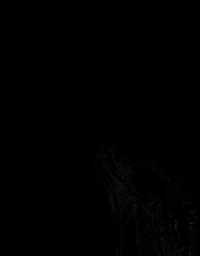
[im 9/18]
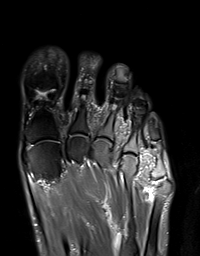
[im 18/18]
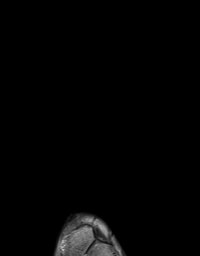

[Series 8: T1 · axial · 3.0mm · 0.62mm/px · z∈[-48,+3]mm · 2 of 14 slices shown (2 of 2)]
[im 1/14]
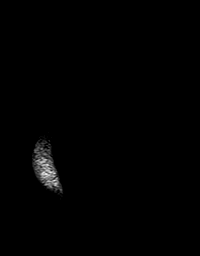
[im 14/14]
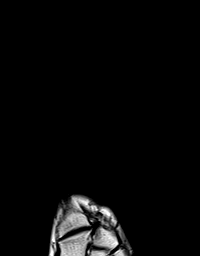

[Series 9: PD fat-sat · sagittal · 3.0mm · 0.47mm/px · 6 of 35 slices shown]
[im 1/35]
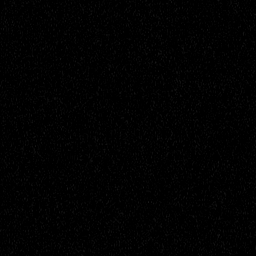
[im 7/35]
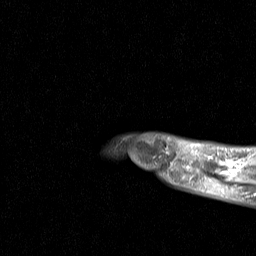
[im 14/35]
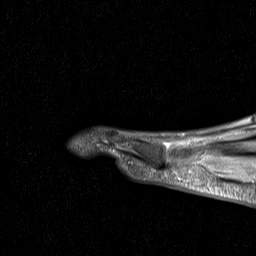
[im 21/35]
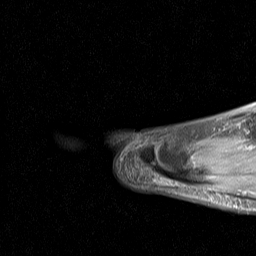
[im 28/35]
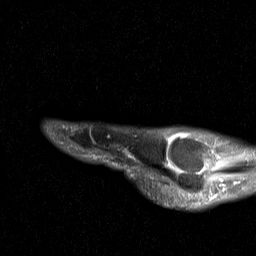
[im 35/35]
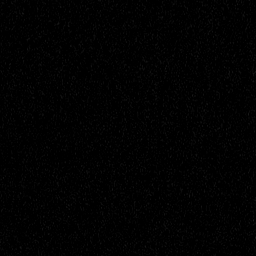

[Series 11: T1 fat-sat · coronal · non-contrast · 3.0mm · 0.44mm/px · 6 of 39 slices shown]
[im 1/39]
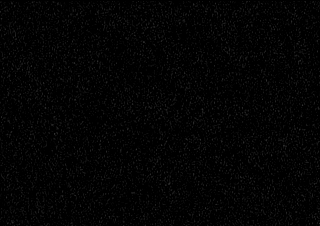
[im 8/39]
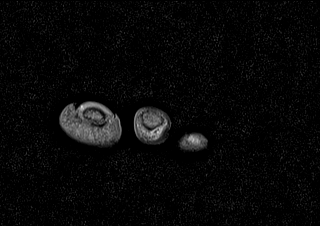
[im 16/39]
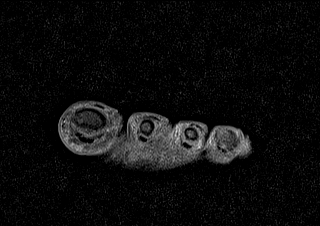
[im 23/39]
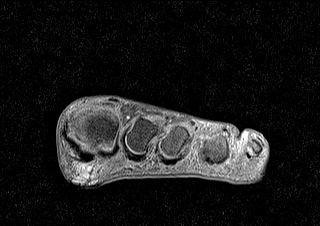
[im 31/39]
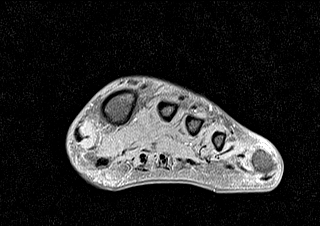
[im 39/39]
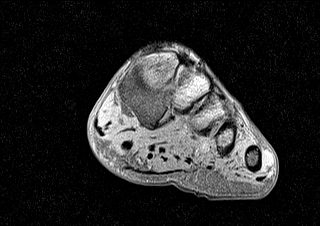

[Series 12: T1 fat-sat post-contrast · coronal · 3.0mm · 0.44mm/px · 6 of 39 slices shown (1 of 2)]
[im 1/39]
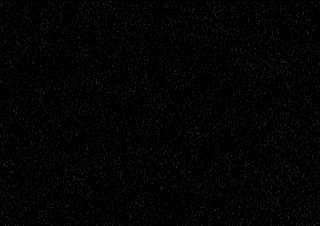
[im 8/39]
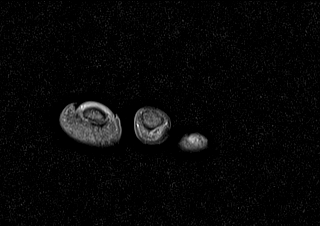
[im 16/39]
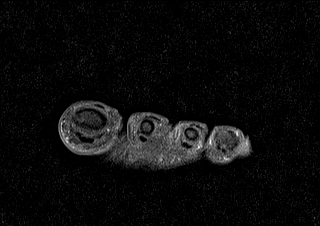
[im 23/39]
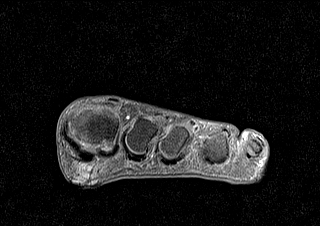
[im 31/39]
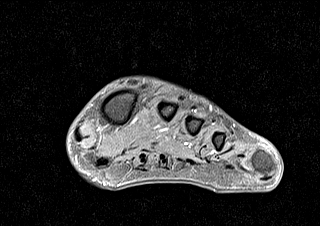
[im 39/39]
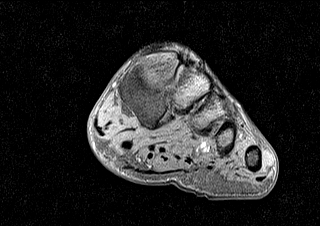

[Series 13: T1 fat-sat post-contrast · axial · 3.0mm · 0.50mm/px · z∈[-56,+10]mm · 3 of 18 slices shown (2 of 2)]
[im 1/18]
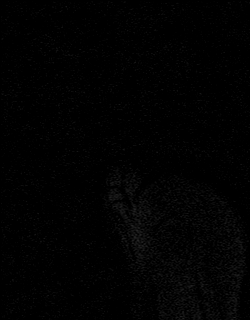
[im 9/18]
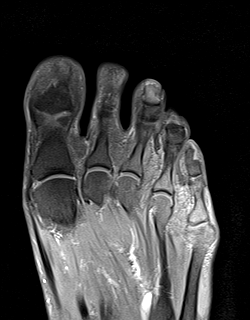
[im 18/18]
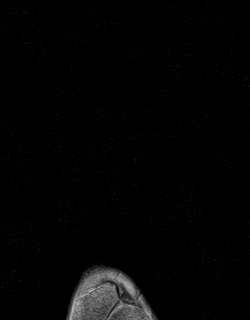

[40 of 40 positions shown; findings below may reference images not displayed]

FINDINGS: Bones/Joint/Cartilage

No acute fracture. No dislocation. Mild first MTP joint space
narrowing with tiny marginal osteophytes. Small first MTP joint
effusion, nonspecific. Mild arthropathy of the hallux-sesamoid
complex laterally. Remaining joint spaces of the forefoot are
relatively well preserved. No erosions. No bone marrow edema or
periostitis.

Ligaments

Intact Lisfranc ligament. Collateral ligaments of the forefoot are
intact. Intact plantar plates.

Muscles and Tendons

Intact flexor and extensor tendons without tendinosis, tear, or
tenosynovitis. Normal muscle bulk and signal intensity without
edema, atrophy, or fatty infiltration.

Soft tissues

No intermetatarsal space mass or fluid collection. No soft tissue
edema. No fluid collections. No ulceration. No abnormal postcontrast
enhancement.
IMPRESSION: 1. No evidence of Morton's neuroma.
2. Mild first MTP and hallux-sesamoid complex osteoarthritis.
3. Small first MTP joint effusion, nonspecific.

## 2022-09-17 DIAGNOSIS — D229 Melanocytic nevi, unspecified: Secondary | ICD-10-CM | POA: Diagnosis not present

## 2022-09-17 DIAGNOSIS — R238 Other skin changes: Secondary | ICD-10-CM | POA: Diagnosis not present

## 2022-09-17 DIAGNOSIS — L570 Actinic keratosis: Secondary | ICD-10-CM | POA: Diagnosis not present

## 2022-09-17 DIAGNOSIS — L821 Other seborrheic keratosis: Secondary | ICD-10-CM | POA: Diagnosis not present

## 2022-09-17 DIAGNOSIS — L814 Other melanin hyperpigmentation: Secondary | ICD-10-CM | POA: Diagnosis not present

## 2022-10-04 DIAGNOSIS — R208 Other disturbances of skin sensation: Secondary | ICD-10-CM | POA: Diagnosis not present

## 2022-10-04 DIAGNOSIS — Z0101 Encounter for examination of eyes and vision with abnormal findings: Secondary | ICD-10-CM | POA: Diagnosis not present

## 2022-10-04 DIAGNOSIS — R198 Other specified symptoms and signs involving the digestive system and abdomen: Secondary | ICD-10-CM | POA: Diagnosis not present

## 2022-10-04 DIAGNOSIS — M6289 Other specified disorders of muscle: Secondary | ICD-10-CM | POA: Diagnosis not present

## 2022-11-25 DIAGNOSIS — Z96651 Presence of right artificial knee joint: Secondary | ICD-10-CM | POA: Diagnosis not present

## 2022-11-25 DIAGNOSIS — Z96652 Presence of left artificial knee joint: Secondary | ICD-10-CM | POA: Diagnosis not present

## 2023-03-24 DIAGNOSIS — I10 Essential (primary) hypertension: Secondary | ICD-10-CM | POA: Diagnosis not present

## 2023-03-24 DIAGNOSIS — E663 Overweight: Secondary | ICD-10-CM | POA: Diagnosis not present

## 2023-03-24 DIAGNOSIS — K6289 Other specified diseases of anus and rectum: Secondary | ICD-10-CM | POA: Diagnosis not present

## 2023-03-24 DIAGNOSIS — R748 Abnormal levels of other serum enzymes: Secondary | ICD-10-CM | POA: Diagnosis not present

## 2023-03-24 DIAGNOSIS — Z0000 Encounter for general adult medical examination without abnormal findings: Secondary | ICD-10-CM | POA: Diagnosis not present

## 2023-03-24 DIAGNOSIS — E7800 Pure hypercholesterolemia, unspecified: Secondary | ICD-10-CM | POA: Diagnosis not present

## 2023-03-24 DIAGNOSIS — Z125 Encounter for screening for malignant neoplasm of prostate: Secondary | ICD-10-CM | POA: Diagnosis not present

## 2023-03-31 DIAGNOSIS — L821 Other seborrheic keratosis: Secondary | ICD-10-CM | POA: Diagnosis not present

## 2023-03-31 DIAGNOSIS — Z85828 Personal history of other malignant neoplasm of skin: Secondary | ICD-10-CM | POA: Diagnosis not present

## 2023-03-31 DIAGNOSIS — D492 Neoplasm of unspecified behavior of bone, soft tissue, and skin: Secondary | ICD-10-CM | POA: Diagnosis not present

## 2023-03-31 DIAGNOSIS — D1801 Hemangioma of skin and subcutaneous tissue: Secondary | ICD-10-CM | POA: Diagnosis not present

## 2023-03-31 DIAGNOSIS — L570 Actinic keratosis: Secondary | ICD-10-CM | POA: Diagnosis not present

## 2023-03-31 DIAGNOSIS — L814 Other melanin hyperpigmentation: Secondary | ICD-10-CM | POA: Diagnosis not present

## 2023-04-18 DIAGNOSIS — R748 Abnormal levels of other serum enzymes: Secondary | ICD-10-CM | POA: Diagnosis not present

## 2023-04-26 DIAGNOSIS — M6289 Other specified disorders of muscle: Secondary | ICD-10-CM | POA: Diagnosis not present

## 2023-04-26 DIAGNOSIS — R208 Other disturbances of skin sensation: Secondary | ICD-10-CM | POA: Diagnosis not present

## 2023-05-24 DIAGNOSIS — H524 Presbyopia: Secondary | ICD-10-CM | POA: Diagnosis not present

## 2023-05-24 DIAGNOSIS — H5203 Hypermetropia, bilateral: Secondary | ICD-10-CM | POA: Diagnosis not present

## 2023-05-24 DIAGNOSIS — H25813 Combined forms of age-related cataract, bilateral: Secondary | ICD-10-CM | POA: Diagnosis not present

## 2023-05-24 DIAGNOSIS — H52223 Regular astigmatism, bilateral: Secondary | ICD-10-CM | POA: Diagnosis not present

## 2023-06-13 DIAGNOSIS — K5902 Outlet dysfunction constipation: Secondary | ICD-10-CM | POA: Diagnosis not present

## 2023-06-13 DIAGNOSIS — M6281 Muscle weakness (generalized): Secondary | ICD-10-CM | POA: Diagnosis not present

## 2023-06-13 DIAGNOSIS — M62838 Other muscle spasm: Secondary | ICD-10-CM | POA: Diagnosis not present

## 2023-06-13 DIAGNOSIS — R208 Other disturbances of skin sensation: Secondary | ICD-10-CM | POA: Diagnosis not present

## 2023-07-04 DIAGNOSIS — M6281 Muscle weakness (generalized): Secondary | ICD-10-CM | POA: Diagnosis not present

## 2023-07-04 DIAGNOSIS — R208 Other disturbances of skin sensation: Secondary | ICD-10-CM | POA: Diagnosis not present

## 2023-07-04 DIAGNOSIS — K5902 Outlet dysfunction constipation: Secondary | ICD-10-CM | POA: Diagnosis not present

## 2023-07-04 DIAGNOSIS — M62838 Other muscle spasm: Secondary | ICD-10-CM | POA: Diagnosis not present

## 2023-07-21 DIAGNOSIS — K5902 Outlet dysfunction constipation: Secondary | ICD-10-CM | POA: Diagnosis not present

## 2023-07-21 DIAGNOSIS — M6281 Muscle weakness (generalized): Secondary | ICD-10-CM | POA: Diagnosis not present

## 2023-07-21 DIAGNOSIS — R208 Other disturbances of skin sensation: Secondary | ICD-10-CM | POA: Diagnosis not present

## 2023-07-21 DIAGNOSIS — M62838 Other muscle spasm: Secondary | ICD-10-CM | POA: Diagnosis not present

## 2023-08-04 DIAGNOSIS — M6281 Muscle weakness (generalized): Secondary | ICD-10-CM | POA: Diagnosis not present

## 2023-08-04 DIAGNOSIS — K5902 Outlet dysfunction constipation: Secondary | ICD-10-CM | POA: Diagnosis not present

## 2023-08-04 DIAGNOSIS — R208 Other disturbances of skin sensation: Secondary | ICD-10-CM | POA: Diagnosis not present

## 2023-08-04 DIAGNOSIS — M62838 Other muscle spasm: Secondary | ICD-10-CM | POA: Diagnosis not present

## 2023-08-18 DIAGNOSIS — M6281 Muscle weakness (generalized): Secondary | ICD-10-CM | POA: Diagnosis not present

## 2023-08-18 DIAGNOSIS — R208 Other disturbances of skin sensation: Secondary | ICD-10-CM | POA: Diagnosis not present

## 2023-08-18 DIAGNOSIS — M62838 Other muscle spasm: Secondary | ICD-10-CM | POA: Diagnosis not present

## 2023-08-18 DIAGNOSIS — K5902 Outlet dysfunction constipation: Secondary | ICD-10-CM | POA: Diagnosis not present

## 2023-09-23 DIAGNOSIS — Z85828 Personal history of other malignant neoplasm of skin: Secondary | ICD-10-CM | POA: Diagnosis not present

## 2023-09-23 DIAGNOSIS — D1801 Hemangioma of skin and subcutaneous tissue: Secondary | ICD-10-CM | POA: Diagnosis not present

## 2023-09-23 DIAGNOSIS — L570 Actinic keratosis: Secondary | ICD-10-CM | POA: Diagnosis not present

## 2023-09-23 DIAGNOSIS — L814 Other melanin hyperpigmentation: Secondary | ICD-10-CM | POA: Diagnosis not present

## 2023-09-23 DIAGNOSIS — L821 Other seborrheic keratosis: Secondary | ICD-10-CM | POA: Diagnosis not present

## 2023-09-26 DIAGNOSIS — M62838 Other muscle spasm: Secondary | ICD-10-CM | POA: Diagnosis not present

## 2023-09-26 DIAGNOSIS — R208 Other disturbances of skin sensation: Secondary | ICD-10-CM | POA: Diagnosis not present

## 2023-09-26 DIAGNOSIS — M6281 Muscle weakness (generalized): Secondary | ICD-10-CM | POA: Diagnosis not present

## 2023-09-26 DIAGNOSIS — K5902 Outlet dysfunction constipation: Secondary | ICD-10-CM | POA: Diagnosis not present
